# Patient Record
Sex: Male | Born: 1955 | Race: White | Hispanic: No | Marital: Married | Smoking: Former smoker
Health system: Southern US, Community
[De-identification: ages and names within clinical notes are randomized; demographics above are authoritative.]

## PROBLEM LIST (undated history)

## (undated) DIAGNOSIS — M545 Low back pain, unspecified: Secondary | ICD-10-CM

## (undated) DIAGNOSIS — Z860100 Personal history of colon polyps, unspecified: Secondary | ICD-10-CM

## (undated) DIAGNOSIS — M5030 Other cervical disc degeneration, unspecified cervical region: Secondary | ICD-10-CM

## (undated) DIAGNOSIS — G479 Sleep disorder, unspecified: Secondary | ICD-10-CM

## (undated) DIAGNOSIS — E785 Hyperlipidemia, unspecified: Secondary | ICD-10-CM

## (undated) DIAGNOSIS — L409 Psoriasis, unspecified: Secondary | ICD-10-CM

## (undated) DIAGNOSIS — K219 Gastro-esophageal reflux disease without esophagitis: Secondary | ICD-10-CM

## (undated) DIAGNOSIS — F419 Anxiety disorder, unspecified: Secondary | ICD-10-CM

## (undated) DIAGNOSIS — R51 Headache: Secondary | ICD-10-CM

## (undated) DIAGNOSIS — A4902 Methicillin resistant Staphylococcus aureus infection, unspecified site: Secondary | ICD-10-CM

## (undated) DIAGNOSIS — M7541 Impingement syndrome of right shoulder: Secondary | ICD-10-CM

## (undated) DIAGNOSIS — Z8249 Family history of ischemic heart disease and other diseases of the circulatory system: Secondary | ICD-10-CM

## (undated) DIAGNOSIS — I10 Essential (primary) hypertension: Secondary | ICD-10-CM

## (undated) DIAGNOSIS — K644 Residual hemorrhoidal skin tags: Secondary | ICD-10-CM

## (undated) DIAGNOSIS — F32A Depression, unspecified: Secondary | ICD-10-CM

## (undated) DIAGNOSIS — N200 Calculus of kidney: Secondary | ICD-10-CM

## (undated) DIAGNOSIS — G8929 Other chronic pain: Secondary | ICD-10-CM

## (undated) DIAGNOSIS — M1990 Unspecified osteoarthritis, unspecified site: Secondary | ICD-10-CM

## (undated) DIAGNOSIS — K2970 Gastritis, unspecified, without bleeding: Secondary | ICD-10-CM

## (undated) DIAGNOSIS — M7710 Lateral epicondylitis, unspecified elbow: Secondary | ICD-10-CM

## (undated) DIAGNOSIS — F329 Major depressive disorder, single episode, unspecified: Secondary | ICD-10-CM

## (undated) DIAGNOSIS — G5710 Meralgia paresthetica, unspecified lower limb: Secondary | ICD-10-CM

## (undated) DIAGNOSIS — R519 Headache, unspecified: Secondary | ICD-10-CM

## (undated) HISTORY — DX: Personal history of colon polyps: Z86.010

## (undated) HISTORY — DX: Lateral epicondylitis, unspecified elbow: M77.10

## (undated) HISTORY — PX: POLYPECTOMY: SHX149

## (undated) HISTORY — DX: Residual hemorrhoidal skin tags: K64.4

## (undated) HISTORY — DX: Major depressive disorder, single episode, unspecified: F32.9

## (undated) HISTORY — PX: COLONOSCOPY: SHX174

## (undated) HISTORY — DX: Other cervical disc degeneration, unspecified cervical region: M50.30

## (undated) HISTORY — DX: Sleep disorder, unspecified: G47.9

## (undated) HISTORY — DX: Other chronic pain: G89.29

## (undated) HISTORY — DX: Personal history of colon polyps, unspecified: Z86.0100

## (undated) HISTORY — DX: Unspecified osteoarthritis, unspecified site: M19.90

## (undated) HISTORY — DX: Methicillin resistant Staphylococcus aureus infection, unspecified site: A49.02

## (undated) HISTORY — DX: Meralgia paresthetica, unspecified lower limb: G57.10

## (undated) HISTORY — DX: Essential (primary) hypertension: I10

## (undated) HISTORY — DX: Anxiety disorder, unspecified: F41.9

## (undated) HISTORY — DX: Hyperlipidemia, unspecified: E78.5

## (undated) HISTORY — DX: Calculus of kidney: N20.0

## (undated) HISTORY — DX: Headache: R51

## (undated) HISTORY — DX: Gastro-esophageal reflux disease without esophagitis: K21.9

## (undated) HISTORY — DX: Impingement syndrome of right shoulder: M75.41

## (undated) HISTORY — DX: Gastritis, unspecified, without bleeding: K29.70

## (undated) HISTORY — DX: Family history of ischemic heart disease and other diseases of the circulatory system: Z82.49

## (undated) HISTORY — PX: FINGER SURGERY: SHX640

## (undated) HISTORY — DX: Low back pain, unspecified: M54.50

## (undated) HISTORY — PX: ROTATOR CUFF REPAIR: SHX139

## (undated) HISTORY — DX: Headache, unspecified: R51.9

## (undated) HISTORY — DX: Low back pain: M54.5

## (undated) HISTORY — DX: Depression, unspecified: F32.A

## (undated) HISTORY — DX: Psoriasis, unspecified: L40.9

---

## 1999-10-02 ENCOUNTER — Emergency Department (HOSPITAL_COMMUNITY): Admission: EM | Admit: 1999-10-02 | Discharge: 1999-10-02 | Payer: Self-pay | Admitting: Emergency Medicine

## 1999-10-21 ENCOUNTER — Emergency Department (HOSPITAL_COMMUNITY): Admission: EM | Admit: 1999-10-21 | Discharge: 1999-10-21 | Payer: Self-pay | Admitting: Emergency Medicine

## 2000-04-27 ENCOUNTER — Emergency Department (HOSPITAL_COMMUNITY): Admission: EM | Admit: 2000-04-27 | Discharge: 2000-04-28 | Payer: Self-pay | Admitting: Emergency Medicine

## 2000-04-28 ENCOUNTER — Encounter: Payer: Self-pay | Admitting: Emergency Medicine

## 2000-05-11 ENCOUNTER — Encounter: Admission: RE | Admit: 2000-05-11 | Discharge: 2000-05-11 | Payer: Self-pay | Admitting: Urology

## 2000-05-11 ENCOUNTER — Encounter: Payer: Self-pay | Admitting: Urology

## 2000-07-11 ENCOUNTER — Encounter: Payer: Self-pay | Admitting: Urology

## 2000-07-11 ENCOUNTER — Encounter: Admission: RE | Admit: 2000-07-11 | Discharge: 2000-07-11 | Payer: Self-pay | Admitting: Urology

## 2000-07-27 ENCOUNTER — Encounter: Payer: Self-pay | Admitting: Urology

## 2000-07-27 ENCOUNTER — Ambulatory Visit (HOSPITAL_BASED_OUTPATIENT_CLINIC_OR_DEPARTMENT_OTHER): Admission: RE | Admit: 2000-07-27 | Discharge: 2000-07-27 | Payer: Self-pay | Admitting: Urology

## 2000-08-11 ENCOUNTER — Encounter: Payer: Self-pay | Admitting: Urology

## 2000-08-11 ENCOUNTER — Encounter: Admission: RE | Admit: 2000-08-11 | Discharge: 2000-08-11 | Payer: Self-pay | Admitting: Urology

## 2000-09-09 ENCOUNTER — Encounter: Admission: RE | Admit: 2000-09-09 | Discharge: 2000-09-09 | Payer: Self-pay | Admitting: Urology

## 2000-09-09 ENCOUNTER — Encounter: Payer: Self-pay | Admitting: Urology

## 2000-09-12 ENCOUNTER — Encounter: Payer: Self-pay | Admitting: Emergency Medicine

## 2000-09-12 ENCOUNTER — Emergency Department (HOSPITAL_COMMUNITY): Admission: EM | Admit: 2000-09-12 | Discharge: 2000-09-12 | Payer: Self-pay | Admitting: Emergency Medicine

## 2000-09-13 ENCOUNTER — Encounter: Payer: Self-pay | Admitting: Urology

## 2000-09-13 ENCOUNTER — Ambulatory Visit (HOSPITAL_COMMUNITY): Admission: RE | Admit: 2000-09-13 | Discharge: 2000-09-13 | Payer: Self-pay | Admitting: Urology

## 2001-05-29 ENCOUNTER — Encounter: Payer: Self-pay | Admitting: Gastroenterology

## 2001-05-29 ENCOUNTER — Encounter: Admission: RE | Admit: 2001-05-29 | Discharge: 2001-05-29 | Payer: Self-pay | Admitting: Gastroenterology

## 2002-09-29 ENCOUNTER — Encounter: Admission: RE | Admit: 2002-09-29 | Discharge: 2002-09-29 | Payer: Self-pay | Admitting: Internal Medicine

## 2002-09-29 ENCOUNTER — Encounter: Payer: Self-pay | Admitting: Internal Medicine

## 2003-03-30 ENCOUNTER — Encounter: Payer: Self-pay | Admitting: Internal Medicine

## 2003-03-30 ENCOUNTER — Ambulatory Visit: Admission: RE | Admit: 2003-03-30 | Discharge: 2003-03-30 | Payer: Self-pay | Admitting: Internal Medicine

## 2004-02-24 ENCOUNTER — Encounter: Admission: RE | Admit: 2004-02-24 | Discharge: 2004-02-24 | Payer: Self-pay | Admitting: Internal Medicine

## 2004-12-02 ENCOUNTER — Ambulatory Visit: Payer: Self-pay | Admitting: Gastroenterology

## 2005-01-08 ENCOUNTER — Encounter: Admission: RE | Admit: 2005-01-08 | Discharge: 2005-01-08 | Payer: Self-pay | Admitting: Orthopedic Surgery

## 2005-05-31 ENCOUNTER — Ambulatory Visit: Payer: Self-pay | Admitting: Gastroenterology

## 2005-06-07 ENCOUNTER — Ambulatory Visit: Payer: Self-pay | Admitting: Gastroenterology

## 2005-06-15 ENCOUNTER — Ambulatory Visit: Payer: Self-pay | Admitting: Gastroenterology

## 2006-07-20 ENCOUNTER — Ambulatory Visit: Payer: Self-pay | Admitting: Internal Medicine

## 2006-07-29 ENCOUNTER — Ambulatory Visit: Payer: Self-pay | Admitting: Internal Medicine

## 2006-10-04 ENCOUNTER — Ambulatory Visit: Payer: Self-pay | Admitting: Internal Medicine

## 2007-05-30 ENCOUNTER — Ambulatory Visit: Payer: Self-pay | Admitting: Internal Medicine

## 2007-05-30 LAB — CONVERTED CEMR LAB
Albumin: 4.2 g/dL (ref 3.5–5.2)
Alkaline Phosphatase: 77 units/L (ref 39–117)
BUN: 15 mg/dL (ref 6–23)
Basophils Absolute: 0 10*3/uL (ref 0.0–0.1)
Cholesterol: 172 mg/dL (ref 0–200)
Creatinine, Ser: 1 mg/dL (ref 0.4–1.5)
GFR calc Af Amer: 103 mL/min
H Pylori IgG: NEGATIVE
HCT: 48.4 % (ref 39.0–52.0)
HCV Ab: NEGATIVE
HDL: 26.5 mg/dL — ABNORMAL LOW (ref >39.0)
Hemoglobin: 16.5 g/dL (ref 13.0–17.0)
Hep B S Ab: NEGATIVE
Hgb A1c MFr Bld: 5.5 % (ref 4.6–6.0)
LDL Cholesterol: 130 mg/dL — ABNORMAL HIGH (ref 0–99)
Lymphocytes Relative: 23.9 % (ref 12.0–46.0)
MCHC: 34.2 g/dL (ref 30.0–36.0)
MCV: 88.7 fL (ref 78.0–100.0)
Monocytes Absolute: 0.4 10*3/uL (ref 0.2–0.7)
Monocytes Relative: 7.4 % (ref 3.0–11.0)
Neutro Abs: 3.5 10*3/uL (ref 1.4–7.7)
Neutrophils Relative %: 67.4 % (ref 43.0–77.0)
Potassium: 4.6 meq/L (ref 3.5–5.1)
RDW: 12.4 % (ref 11.5–14.6)
Sed Rate: 6 mm/hr (ref 0–20)
Sodium: 142 meq/L (ref 135–145)
Total Bilirubin: 0.6 mg/dL (ref 0.3–1.2)
Total Protein: 7.3 g/dL (ref 6.0–8.3)

## 2007-08-15 ENCOUNTER — Ambulatory Visit: Payer: Self-pay | Admitting: Endocrinology

## 2007-10-03 ENCOUNTER — Telehealth: Payer: Self-pay | Admitting: Internal Medicine

## 2007-10-12 ENCOUNTER — Telehealth: Payer: Self-pay | Admitting: Internal Medicine

## 2007-11-21 ENCOUNTER — Ambulatory Visit: Payer: Self-pay | Admitting: Internal Medicine

## 2007-11-21 DIAGNOSIS — M1990 Unspecified osteoarthritis, unspecified site: Secondary | ICD-10-CM | POA: Insufficient documentation

## 2007-11-21 DIAGNOSIS — M5441 Lumbago with sciatica, right side: Secondary | ICD-10-CM

## 2007-11-21 DIAGNOSIS — Z860100 Personal history of colon polyps, unspecified: Secondary | ICD-10-CM

## 2007-11-21 DIAGNOSIS — G5710 Meralgia paresthetica, unspecified lower limb: Secondary | ICD-10-CM | POA: Insufficient documentation

## 2007-11-21 DIAGNOSIS — G8929 Other chronic pain: Secondary | ICD-10-CM | POA: Insufficient documentation

## 2007-11-21 HISTORY — DX: Personal history of colonic polyps: Z86.010

## 2007-11-21 HISTORY — DX: Unspecified osteoarthritis, unspecified site: M19.90

## 2007-11-21 HISTORY — DX: Personal history of colon polyps, unspecified: Z86.0100

## 2007-11-21 HISTORY — DX: Other chronic pain: G89.29

## 2007-12-07 DIAGNOSIS — A4902 Methicillin resistant Staphylococcus aureus infection, unspecified site: Secondary | ICD-10-CM | POA: Insufficient documentation

## 2007-12-07 HISTORY — DX: Methicillin resistant Staphylococcus aureus infection, unspecified site: A49.02

## 2007-12-21 ENCOUNTER — Encounter: Payer: Self-pay | Admitting: Internal Medicine

## 2007-12-27 ENCOUNTER — Ambulatory Visit: Payer: Self-pay | Admitting: Internal Medicine

## 2008-02-06 ENCOUNTER — Encounter (INDEPENDENT_AMBULATORY_CARE_PROVIDER_SITE_OTHER): Payer: Self-pay | Admitting: *Deleted

## 2008-05-10 ENCOUNTER — Encounter: Payer: Self-pay | Admitting: *Deleted

## 2008-05-10 DIAGNOSIS — K2990 Gastroduodenitis, unspecified, without bleeding: Secondary | ICD-10-CM

## 2008-05-10 DIAGNOSIS — K644 Residual hemorrhoidal skin tags: Secondary | ICD-10-CM | POA: Insufficient documentation

## 2008-05-10 DIAGNOSIS — E782 Mixed hyperlipidemia: Secondary | ICD-10-CM

## 2008-05-10 DIAGNOSIS — Z9189 Other specified personal risk factors, not elsewhere classified: Secondary | ICD-10-CM | POA: Insufficient documentation

## 2008-05-10 DIAGNOSIS — K219 Gastro-esophageal reflux disease without esophagitis: Secondary | ICD-10-CM

## 2008-05-10 DIAGNOSIS — M5030 Other cervical disc degeneration, unspecified cervical region: Secondary | ICD-10-CM | POA: Insufficient documentation

## 2008-05-10 DIAGNOSIS — Z87442 Personal history of urinary calculi: Secondary | ICD-10-CM | POA: Insufficient documentation

## 2008-05-10 DIAGNOSIS — F341 Dysthymic disorder: Secondary | ICD-10-CM

## 2008-05-10 DIAGNOSIS — M7710 Lateral epicondylitis, unspecified elbow: Secondary | ICD-10-CM

## 2008-05-10 DIAGNOSIS — M7580 Other shoulder lesions, unspecified shoulder: Secondary | ICD-10-CM

## 2008-05-10 DIAGNOSIS — M25819 Other specified joint disorders, unspecified shoulder: Secondary | ICD-10-CM

## 2008-05-10 DIAGNOSIS — K2970 Gastritis, unspecified, without bleeding: Secondary | ICD-10-CM | POA: Insufficient documentation

## 2008-05-10 DIAGNOSIS — I10 Essential (primary) hypertension: Secondary | ICD-10-CM | POA: Insufficient documentation

## 2008-05-10 HISTORY — DX: Other cervical disc degeneration, unspecified cervical region: M50.30

## 2008-05-10 HISTORY — DX: Essential (primary) hypertension: I10

## 2008-05-10 HISTORY — DX: Gastritis, unspecified, without bleeding: K29.70

## 2008-05-10 HISTORY — DX: Lateral epicondylitis, unspecified elbow: M77.10

## 2008-05-10 HISTORY — DX: Dysthymic disorder: F34.1

## 2008-05-10 HISTORY — DX: Other specified personal risk factors, not elsewhere classified: Z91.89

## 2008-05-10 HISTORY — DX: Residual hemorrhoidal skin tags: K64.4

## 2008-05-10 HISTORY — DX: Mixed hyperlipidemia: E78.2

## 2008-05-10 HISTORY — DX: Other specified joint disorders, unspecified shoulder: M25.819

## 2008-05-10 HISTORY — DX: Gastro-esophageal reflux disease without esophagitis: K21.9

## 2008-05-10 HISTORY — DX: Personal history of urinary calculi: Z87.442

## 2008-06-13 ENCOUNTER — Ambulatory Visit: Payer: Self-pay | Admitting: Internal Medicine

## 2008-06-13 DIAGNOSIS — L0390 Cellulitis, unspecified: Secondary | ICD-10-CM

## 2008-06-13 DIAGNOSIS — L0291 Cutaneous abscess, unspecified: Secondary | ICD-10-CM

## 2008-06-13 DIAGNOSIS — B957 Other staphylococcus as the cause of diseases classified elsewhere: Secondary | ICD-10-CM | POA: Insufficient documentation

## 2008-06-13 DIAGNOSIS — N63 Unspecified lump in unspecified breast: Secondary | ICD-10-CM | POA: Insufficient documentation

## 2008-06-13 HISTORY — DX: Unspecified lump in unspecified breast: N63.0

## 2008-06-13 HISTORY — DX: Cutaneous abscess, unspecified: L02.91

## 2008-06-13 HISTORY — DX: Other staphylococcus as the cause of diseases classified elsewhere: B95.7

## 2008-06-13 LAB — CONVERTED CEMR LAB
BUN: 12 mg/dL (ref 6–23)
Basophils Absolute: 0 10*3/uL (ref 0.0–0.1)
Basophils Relative: 0.5 % (ref 0.0–1.0)
CO2: 30 meq/L (ref 19–32)
Calcium: 9.6 mg/dL (ref 8.4–10.5)
Chloride: 105 meq/L (ref 96–112)
Creatinine, Ser: 0.9 mg/dL (ref 0.4–1.5)
Eosinophils Absolute: 0.1 10*3/uL (ref 0.0–0.7)
Eosinophils Relative: 1.3 % (ref 0.0–5.0)
GFR calc Af Amer: 115 mL/min
GFR calc non Af Amer: 95 mL/min
Glucose, Bld: 91 mg/dL (ref 70–99)
HCT: 46 % (ref 39.0–52.0)
Hemoglobin: 15.9 g/dL (ref 13.0–17.0)
Lymphocytes Relative: 24.7 % (ref 12.0–46.0)
MCHC: 34.5 g/dL (ref 30.0–36.0)
MCV: 90 fL (ref 78.0–100.0)
Monocytes Absolute: 0.7 10*3/uL (ref 0.1–1.0)
Monocytes Relative: 9.1 % (ref 3.0–12.0)
Neutro Abs: 4.6 10*3/uL (ref 1.4–7.7)
Neutrophils Relative %: 64.4 % (ref 43.0–77.0)
Platelets: 276 10*3/uL (ref 150–400)
Potassium: 4.7 meq/L (ref 3.5–5.1)
RBC: 5.11 M/uL (ref 4.22–5.81)
RDW: 12.7 % (ref 11.5–14.6)
Sodium: 141 meq/L (ref 135–145)
WBC: 7.2 10*3/uL (ref 4.5–10.5)

## 2008-06-28 ENCOUNTER — Encounter: Admission: RE | Admit: 2008-06-28 | Discharge: 2008-06-28 | Payer: Self-pay | Admitting: Internal Medicine

## 2009-04-14 ENCOUNTER — Ambulatory Visit: Payer: Self-pay | Admitting: Internal Medicine

## 2009-04-14 DIAGNOSIS — R21 Rash and other nonspecific skin eruption: Secondary | ICD-10-CM

## 2009-04-14 DIAGNOSIS — M129 Arthropathy, unspecified: Secondary | ICD-10-CM

## 2009-04-14 HISTORY — DX: Arthropathy, unspecified: M12.9

## 2009-04-14 HISTORY — DX: Rash and other nonspecific skin eruption: R21

## 2009-04-14 LAB — CONVERTED CEMR LAB: Vit D, 25-Hydroxy: 39 ng/mL (ref 30–89)

## 2009-04-17 LAB — CONVERTED CEMR LAB
BUN: 20 mg/dL (ref 6–23)
Basophils Relative: 0.5 % (ref 0.0–3.0)
CO2: 30 meq/L (ref 19–32)
Calcium: 9.6 mg/dL (ref 8.4–10.5)
Creatinine, Ser: 0.8 mg/dL (ref 0.4–1.5)
Eosinophils Relative: 0.9 % (ref 0.0–5.0)
Glucose, Bld: 80 mg/dL (ref 70–99)
HCT: 45.6 % (ref 39.0–52.0)
Ketones, ur: NEGATIVE mg/dL
Leukocytes, UA: NEGATIVE
Lymphs Abs: 1.9 10*3/uL (ref 0.7–4.0)
MCV: 89.5 fL (ref 78.0–100.0)
Monocytes Absolute: 0.4 10*3/uL (ref 0.1–1.0)
Monocytes Relative: 5.7 % (ref 3.0–12.0)
Neutrophils Relative %: 66.1 % (ref 43.0–77.0)
RBC: 5.09 M/uL (ref 4.22–5.81)
Rhuematoid fact SerPl-aCnc: <20 intl units/mL (ref 0.0–20.0)
Sed Rate: 8 mm/hr (ref 0–22)
Sodium: 143 meq/L (ref 135–145)
Specific Gravity, Urine: >=1.03 (ref 1.000–1.030)
TSH: 3 microintl units/mL (ref 0.35–5.50)
Uric Acid, Serum: 6.2 mg/dL (ref 4.0–7.8)
Urobilinogen, UA: 0.2 (ref 0.0–1.0)
WBC: 7.2 10*3/uL (ref 4.5–10.5)
pH: 6 (ref 5.0–8.0)

## 2009-06-23 ENCOUNTER — Telehealth: Payer: Self-pay | Admitting: Internal Medicine

## 2009-09-11 ENCOUNTER — Telehealth: Payer: Self-pay | Admitting: Internal Medicine

## 2009-09-12 ENCOUNTER — Ambulatory Visit: Payer: Self-pay | Admitting: Internal Medicine

## 2009-09-12 ENCOUNTER — Ambulatory Visit: Payer: Self-pay | Admitting: Physician Assistant

## 2009-09-12 DIAGNOSIS — M25569 Pain in unspecified knee: Secondary | ICD-10-CM | POA: Insufficient documentation

## 2009-09-12 HISTORY — DX: Pain in unspecified knee: M25.569

## 2009-09-25 ENCOUNTER — Telehealth: Payer: Self-pay | Admitting: Internal Medicine

## 2009-10-03 ENCOUNTER — Telehealth: Payer: Self-pay | Admitting: Internal Medicine

## 2010-01-25 IMAGING — CR DG KNEE 1-2V*L*
2 series · 2 of 2 positions shown · non-contrast
Comparison: None

CLINICAL DATA: Left knee pain.

LEFT KNEE - 1-2 VIEW

[view not recorded (1 of 2)]
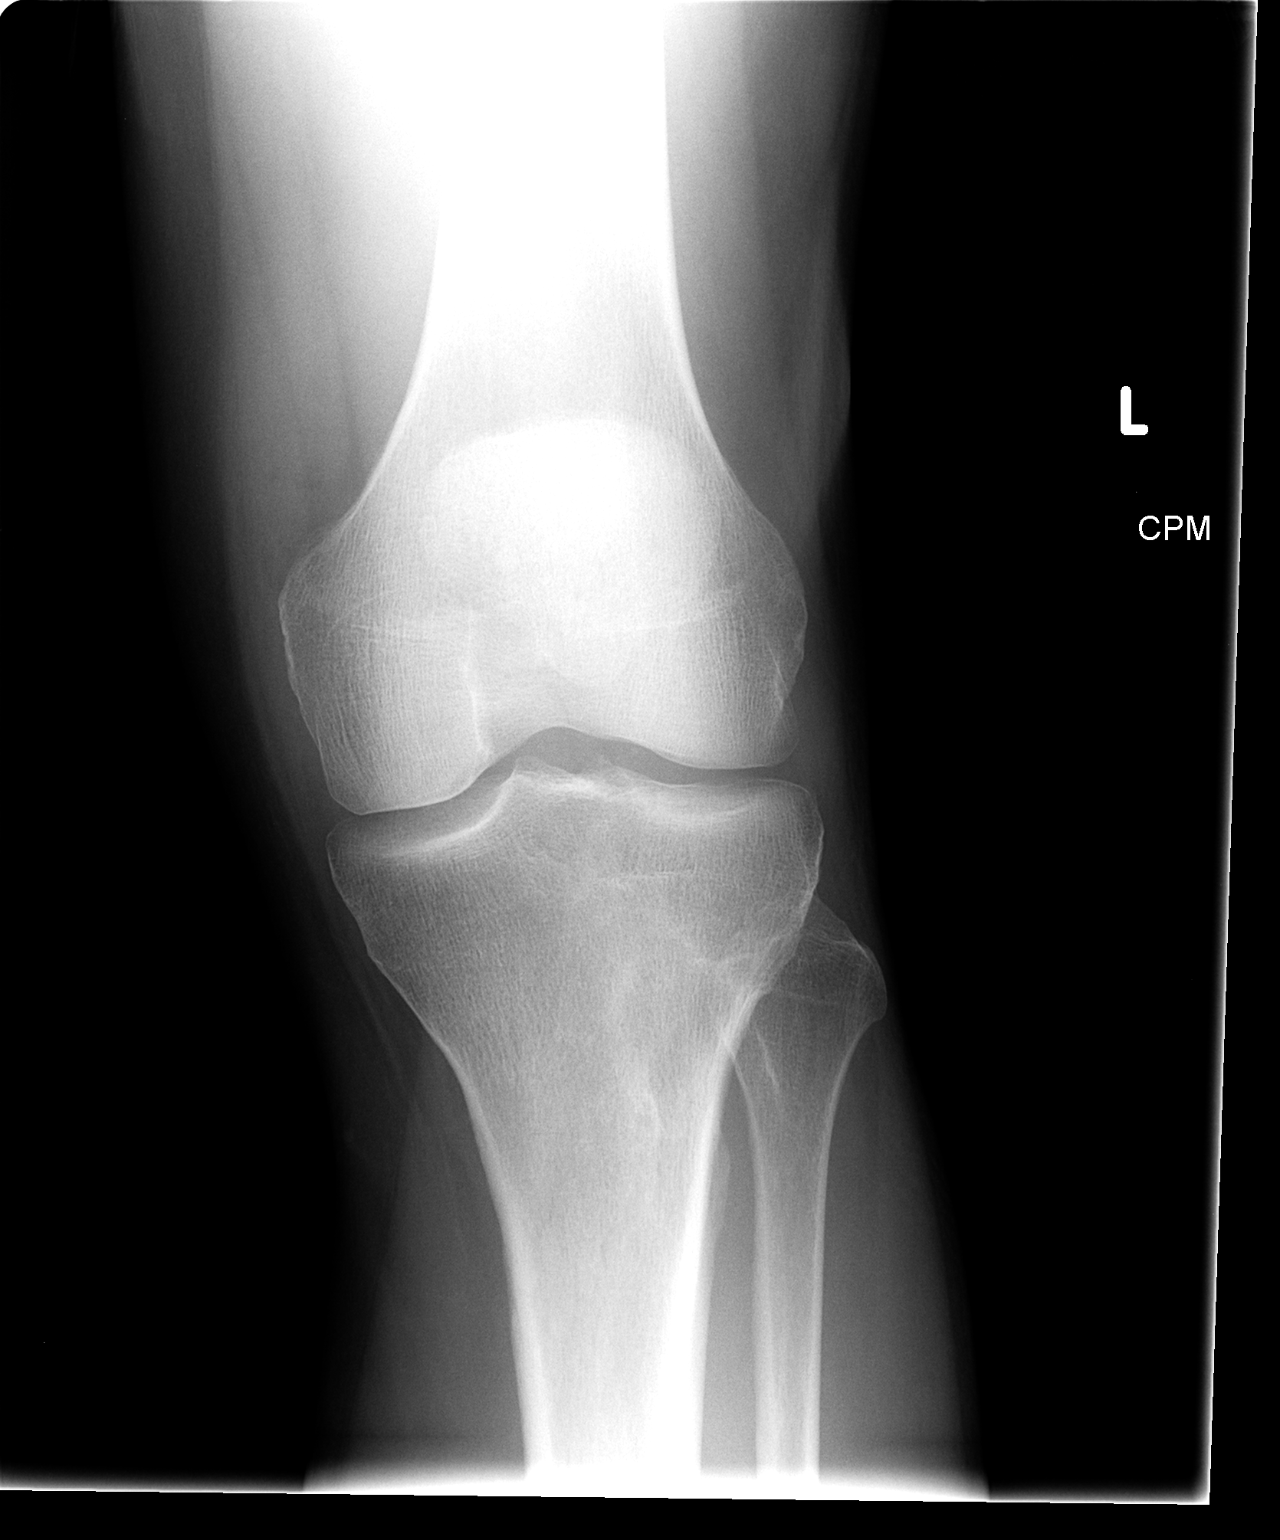

[view not recorded (2 of 2)]
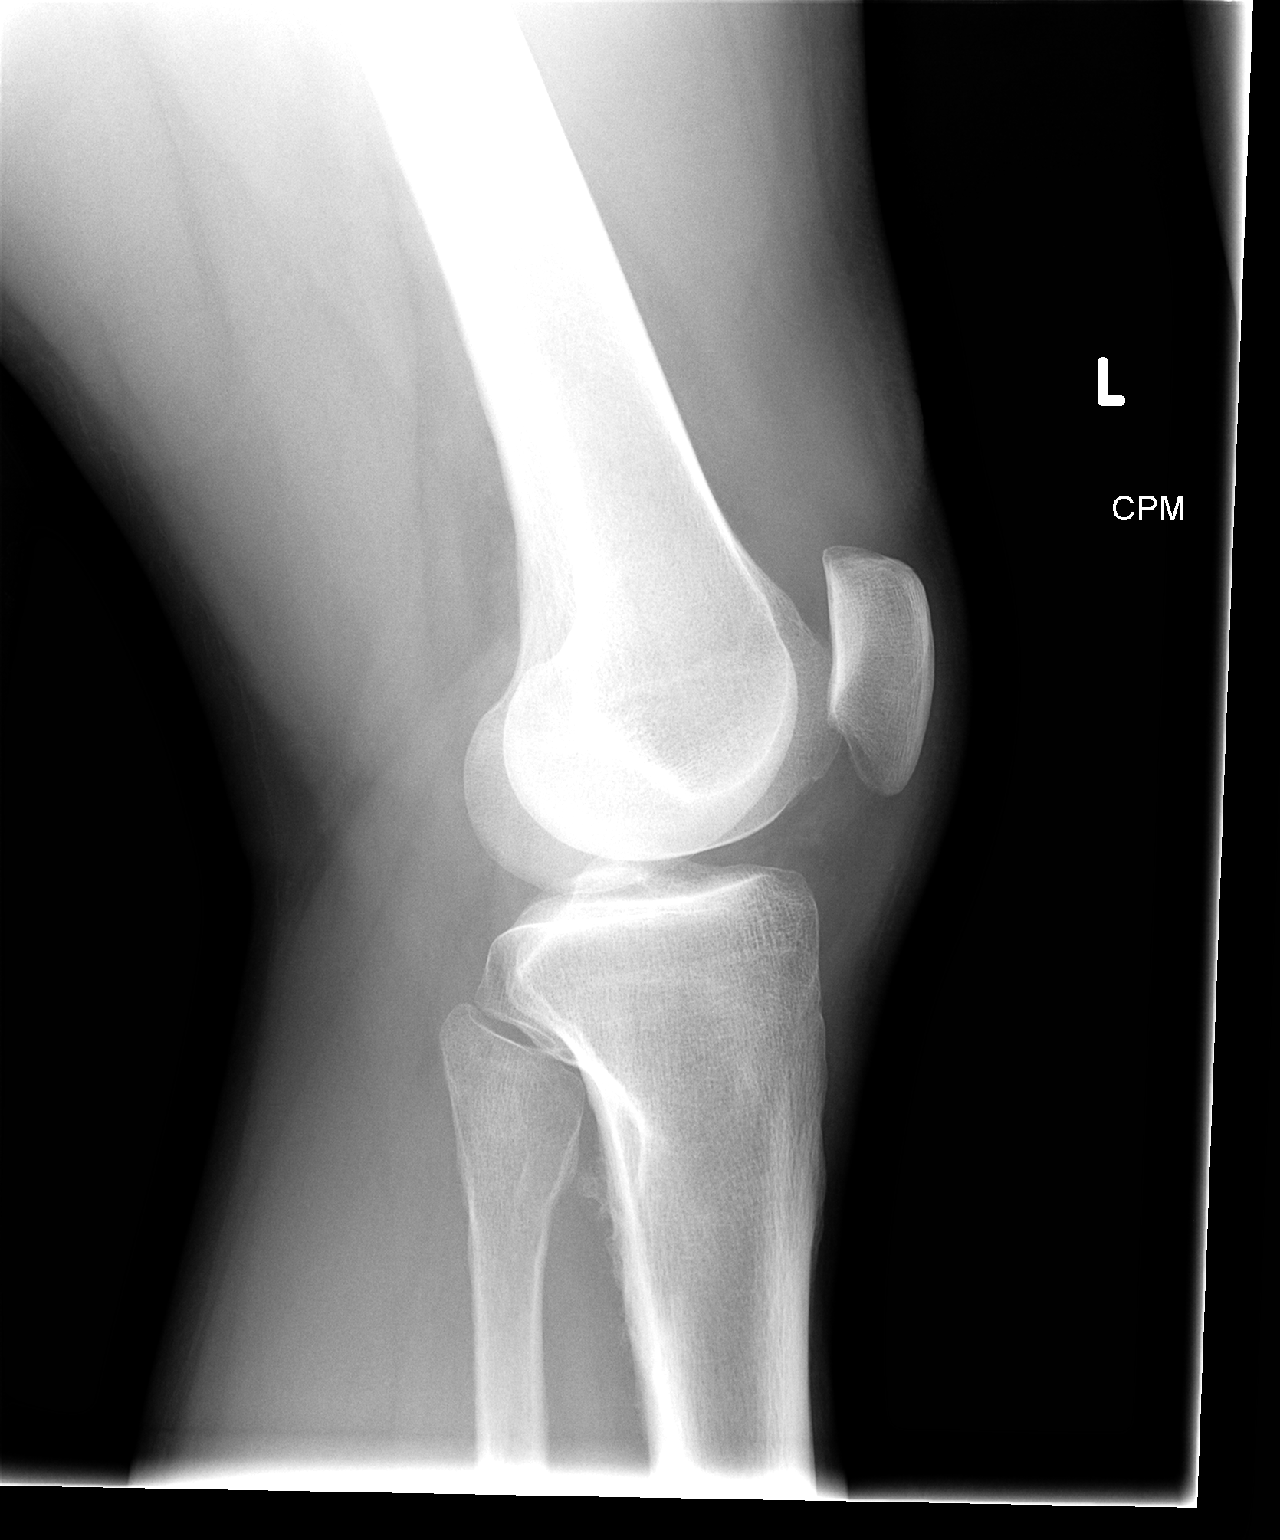

[2 of 2 positions shown; findings below may reference images not displayed]

FINDINGS: The joint spaces are maintained.  No fractures are seen.
There is a suprapatellar knee joint effusion.
IMPRESSION: 1.  No acute bony findings or significant degenerative changes.
2.  Knee joint effusion.

## 2010-02-16 ENCOUNTER — Telehealth: Payer: Self-pay | Admitting: Internal Medicine

## 2010-09-18 ENCOUNTER — Ambulatory Visit: Payer: Self-pay | Admitting: Cardiovascular Disease

## 2010-09-28 ENCOUNTER — Encounter: Payer: Self-pay | Admitting: Internal Medicine

## 2010-10-07 ENCOUNTER — Encounter: Payer: Self-pay | Admitting: Internal Medicine

## 2010-10-15 ENCOUNTER — Telehealth: Payer: Self-pay | Admitting: Internal Medicine

## 2010-11-05 ENCOUNTER — Telehealth (INDEPENDENT_AMBULATORY_CARE_PROVIDER_SITE_OTHER): Payer: Self-pay | Admitting: *Deleted

## 2010-11-06 ENCOUNTER — Ambulatory Visit (HOSPITAL_COMMUNITY)
Admission: RE | Admit: 2010-11-06 | Discharge: 2010-11-06 | Payer: Self-pay | Source: Home / Self Care | Admitting: Cardiovascular Disease

## 2010-11-06 ENCOUNTER — Encounter: Payer: Self-pay | Admitting: Internal Medicine

## 2010-11-11 ENCOUNTER — Telehealth (INDEPENDENT_AMBULATORY_CARE_PROVIDER_SITE_OTHER): Payer: Self-pay | Admitting: *Deleted

## 2010-11-12 ENCOUNTER — Ambulatory Visit (HOSPITAL_COMMUNITY)
Admission: RE | Admit: 2010-11-12 | Discharge: 2010-11-12 | Payer: Self-pay | Source: Home / Self Care | Attending: Cardiovascular Disease | Admitting: Cardiovascular Disease

## 2010-11-12 ENCOUNTER — Ambulatory Visit: Payer: Self-pay

## 2010-11-17 ENCOUNTER — Telehealth: Payer: Self-pay | Admitting: Cardiovascular Disease

## 2011-01-05 NOTE — Miscellaneous (Signed)
Summary: Appointment Canceled  Appointment status changed to canceled by LinkLog[employer] on 10/07/2010 1:40 PM.  Cancellation Comments --------------------- c60/ stress echo/ dx: chestpain/ gd  Appointment Information ----------------------- Appt Type:  EMR NO DOCUMENT      Date:  Monday, October 12, 2010      Time:  8:30 AM for 30 min   Urgency:  Routine   Made By:  Pearson Grippe  To Visit:  [id number]    Reason:  c60/ stress echo/ dx: chestpain/ gd  Appt Comments ------------- -- 10/07/10 13:40: (CEMR) CANCELED -- c60/ stress echo/ dx: chestpain/ gd -- 09/28/10 11:22: (CEMR) BOOKED -- Routine EMR NO DOCUMENT at 10/12/2010 8:30 AM for 30 min c60/ stress echo/ dx: chestpain/ gd -- 09/28/10 11:21: (CEMR) BOOKED -- Routine EMR

## 2011-01-05 NOTE — Miscellaneous (Signed)
Summary: Appointment Canceled  Appointment status changed to canceled by [employer]Log[employer] on 09/28/2010 11:21 AM.  Cancellation Comments --------------------- ecs/ dx: chestpain.  pt has medicaid. gd  Appointment Information ----------------------- Appt Type:  CARDIOLOGY ANCILLARY VISIT      Date:  Thursday, October 01, 2010      Time:  11:30 AM for 60 min   Urgency:  Routine   Made By:  Pearson Grippe  To Visit:  [id number]    Reason:  ecs/ dx: chestpain.  pt has medicaid. gd  Appt Comments ------------- -- 09/28/10 11:21: (CEMR) CANCELED -- ecs/ dx: chestpain.  pt has medicaid. gd -- 09/18/10 11:12: (CEMR) BOOKED -- Routine CARDIOLOGY ANCILLARY VISIT at 10/01/2010 11:30 AM for 60 min ecs/ dx: chestpain.  pt has medicaid. gd

## 2011-01-05 NOTE — Progress Notes (Signed)
Summary: Stress Echo Pre-Procedure  Phone Note Outgoing Call   Call placed by: Antionette Char RN,  November 05, 2010 11:31 AM Call placed to: Patient Reason for Call: Confirm/change Appt Summary of Call: Patient given instructions for stress echo. Annice Pih SmithRN

## 2011-01-05 NOTE — Miscellaneous (Signed)
Summary: Appointment Canceled  Appointment status changed to canceled by [employer] on 09/28/2010 11:12 AM.  Cancellation Comments --------------------- ec6 / stress echo/ gd  Appointment Information ----------------------- Appt Type:  CARDIOLOGY ANCILLARY VISIT      Date:  Thursday, October 01, 2010      Time:  11:30 AM for 60 min   Urgency:  Routine   Made By:  Pearson Grippe  To Visit:  [id number]    Reason:  ec6 / stress echo/ gd  Appt Comments ------------- -- 09/28/10 11:12: (CEMR) CANCELED -- ec6 / stress echo/ gd -- 09/18/10 11:12: (CEMR) BOOKED -- Routine CARDIOLOGY ANCILLARY VISIT at 10/01/2010 11:30 AM for 60 min ec6 / stress echo/ gd

## 2011-01-05 NOTE — Miscellaneous (Signed)
Summary: Appointment Canceled  Appointment status changed to canceled by [employer] on 10/07/2010 1:40 PM.  Cancellation Comments --------------------- ecs/ dx: chestpain.  pt has medicaid. gd  Appointment Information ----------------------- Appt Type:  CARDIOLOGY ANCILLARY VISIT      Date:  Monday, October 12, 2010      Time:  8:30 AM for 60 min   Urgency:  Routine   Made By:  Pearson Grippe  To Visit:  [id number]    Reason:  ecs/ dx: chestpain.  pt has medicaid. gd  Appt Comments ------------- -- 10/07/10 13:40: (CEMR) CANCELED -- ecs/ dx: chestpain.  pt has medicaid. gd -- 09/28/10 11:21: (CEMR) BOOKED -- Routine CARDIOLOGY ANCILLARY VISIT at 10/12/2010 8:30 AM for 60 min ecs/ dx: chestpain.  pt has medicaid. gd

## 2011-01-05 NOTE — Progress Notes (Signed)
Summary: Stress Echo Pre-Procedure  Phone Note Outgoing Call   Call placed by: Milana Na, EMT-P,  November 11, 2010 3:49 PM Summary of Call: Reviewed information on [employer] Information Sheet (see scanned document for further details).  Spoke with patient's wife for Stress Echo.

## 2011-01-05 NOTE — Progress Notes (Signed)
Summary: PSA?  Phone Note Call from Patient Call back at Cell [phone]   Summary of Call: Pt's wife called. She wanted to know when last PSA was and if she was due for check. Pt is due to cpx & labs b/c it has been > 1 year since last office visit. I could not give any info to pt's wife due to pt not having HIPPA form on file. I left vm for pt to call office back.  Initial call taken by: Lamar Sprinkles, CMA,  October 15, 2010 11:14 AM  Follow-up for Phone Call        Pt informed  Follow-up by: Lamar Sprinkles, CMA,  October 16, 2010 10:33 AM

## 2011-01-05 NOTE — Progress Notes (Signed)
Summary: REQ FOR ABX  Phone Note Call from Patient Call back at Home Phone [phone]   Summary of Call: Pt was given rx of doxycycline last july for boil on his leg. He was told if another one developed to call office and he would prescribe another round of antibiotics. Pt c/o red, hot, swollen, "ingrown hair" on inner thigh that started yesterday and he would like rx.  Initial call taken by: Lamar Sprinkles, CMA,  February 16, 2010 8:40 AM  Follow-up for Phone Call        rx is fine but please inform him that it is my recommendation that he come in for an I and D Follow-up by: Etta Grandchild MD,  February 16, 2010 8:57 AM  Additional Follow-up for Phone Call Additional follow up Details #1::        left mess to call office back.....................Marland KitchenLamar Sprinkles, CMA  February 16, 2010 5:50 PM   left mess to call office back...................Marland KitchenLamar Sprinkles, CMA  February 17, 2010 6:24 PM   left mess to call office back........................Marland KitchenLamar Sprinkles, CMA  February 19, 2010 9:35 AM     Additional Follow-up for Phone Call Additional follow up Details #2::    left mess to call office back......................closing phone note. Follow-up by: Lamar Sprinkles, CMA,  February 20, 2010 4:09 PM

## 2011-01-07 NOTE — Miscellaneous (Signed)
Summary: Appointment Canceled  Appointment status changed to canceled by [employer] on 11/06/2010 3:35 PM.  Cancellation Comments --------------------- ECS/ STRESS ECHO/ DX: CHESTPAIN/ PT HAS MEDICADE. GD  Appointment Information ----------------------- Appt Type:  CARDIOLOGY ANCILLARY VISIT      Date:  Friday, November 06, 2010      Time:  2:00 PM for 60 min   Urgency:  Routine   Made By:  Hoy Finlay Scheduler  To Visit:  [id number]    Reason:  ECS/ STRESS ECHO/ DX: CHESTPAIN/ PT HAS MEDICADE. GD  Appt Comments ------------- -- 11/06/10 15:35: (CEMR) CANCELED -- ECS/ STRESS ECHO/ DX: CHESTPAIN/ PT HAS MEDICADE. GD -- 11/06/10 13:54: (CEMR) ARRIVED -- ECS/ STRESS ECHO/ DX: CHESTPAIN/ PT HAS MEDICADE. GD -- 11/05/10 16:32: (CEMR) BOOKED -- Routine CARDIOLOGY ANCILLARY VISIT a

## 2011-01-07 NOTE — Progress Notes (Signed)
Summary: stress test results  Phone Note Outgoing Call   Call placed by: Dessie Coma  LPN,  November 17, 2010 9:36 AM Call placed to: Patient Summary of Call: Patient notified per Dr. Kirke Corin, stress test was normal.

## 2011-01-07 NOTE — Miscellaneous (Signed)
Summary: Appointment Canceled  Appointment status changed to canceled by [employer]Log[employer] on 11/06/2010 3:37 PM.  Cancellation Comments --------------------- C60- STRESS ECHO/ DX: CHESTPAIN/ PT HAS MEDICAID. GD  Appointment Information ----------------------- Appt Type:  EMR NO DOCUMENT      Date:  Friday, November 06, 2010      Time:  2:00 PM for 30 min   Urgency:  Routine   Made By:  Hoy Finlay Scheduler  To Visit:  [id number]    Reason:  C60- STRESS ECHO/ DX: CHESTPAIN/ PT HAS MEDICAID. GD  Appt Comments ------------- -- 11/06/10 15:37: (CEMR) CANCELED -- C60- STRESS ECHO/ DX: CHESTPAIN/ PT HAS MEDICAID. GD -- 11/06/10 14:22: (CEMR) ARRIVED -- C60- STRESS ECHO/ DX: CHESTPAIN/ PT HAS MEDICAID. GD -- 10/20/10 10:55: (CEMR) BOOKED -- Routine EMR NO DOCUMENT at 11/07/19

## 2011-04-20 NOTE — Letter (Signed)
September 18, 2010    Gus Height, PA  [facility]  [address]  [place], [place] [address]   RE:  OLUWATOMISIN, DEMAN  MRN:  [id number]  /  DOB:  [birth year]   Dear Sue Lush:   Thank you for referring Mr. Kessinger for further cardiac evaluation.  As  you are aware, this is a pleasant 55 year old gentleman with the  following problem list:  1. Tobacco use.  2. Family history of premature coronary artery disease.   Mr. Catherman is here today for evaluation of recent chest pain.  This  started about 2 weeks ago.  He describes substernal chest discomfort  described as a sharp pain that happens at rest.  It does not radiate to  his arm or neck.  Usually it happens at rest and lasts for a few  minutes.  It resolves continuously.  Overall it is mild in severity.  He  exercises by walking and usually does not get exertional chest tightness  or heaviness.  There has been no dizziness, syncope or presyncope.  He  is currently trying to quit smoking.   PAST MEDICAL HISTORY:  Is as outlined above.   MEDICATIONS:  Include vitamin B12 once daily.   ALLERGIES:  No known drug allergies.   SOCIAL HISTORY:  He currently smokes three to four cigarettes a day and  trying to quit.  He used to smoke half a pack per day for 30 years.  He  denies any alcohol or recreational drug use.  The patient was recently  laid off from his work; he used to work as a Careers adviser.   PAST SURGICAL HISTORY:  Included right rotator cuff surgery.   REVIEW OF SYSTEMS:  Is remarkable for chest pain and mild dyspnea.  A  full review of system was performed and is otherwise negative.   FAMILY HISTORY:  Is remarkable for premature coronary artery disease.  His mother died at the age of 80 from myocardial infarction.  His sister  also died at the age of 104 from myocardial infarction.   PHYSICAL EXAMINATION:  GENERAL:  The patient is pleasant and appears to  be in no acute distress.  VITAL SIGNS:  Weight  is 178.2 pounds, blood pressure is 134/89, pulse is  74, oxygen saturation is 96% on room air.  HEENT:  Normocephalic, atraumatic.  NECK:  No JVD or carotid bruits.  RESPIRATORY:  Normal respiratory effort with no use of accessory  muscles.  Auscultation reveals normal breath sounds.  CARDIOVASCULAR:  Normal PMI.  Normal S1-S2 with no gallops or murmurs.  ABDOMEN:  Benign, nontender, nondistended.  EXTREMITIES:  With no clubbing, cyanosis or edema.  SKIN:  Warm and dry with no rash.  PSYCHIATRIC:  He is alert, oriented x3 with normal mood and affect.  MUSCULOSKELETAL:  There is normal muscle strength in the upper and lower  extremities.   An electrocardiogram was performed which showed normal sinus rhythm with  nonspecific ST-T wave changes.   IMPRESSION:  Atypical chest pain:  His current chest pain is really  atypical for angina.  It is sharp and it happens at rest.  It does not  seem to be pleuritic either.  His risk factors include tobacco use as  well as strong family history of premature coronary artery disease.  Thus, I think this warrants further evaluation.  I recommend proceeding  with a stress echocardiogram for further evaluation.  I instructed him  to take aspirin 81 mg daily  indefinitely due to his family history.  Also lifestyle changes with diet and exercise are recommended.  He will  follow up after his stress test.   Thank you for allowing me to participate in the care of your patient.    Sincerely,      Lorine Bears, MD  Electronically Signed    MA/MedQ  DD: 09/18/2010  DT: 09/18/2010  Job #: [id number]

## 2011-04-23 NOTE — Op Note (Signed)
. [facility]  Patient:    Brandon Wong, Brandon Wong                    MRN: [id number] Proc. Date: 07/27/00 Adm. Date:  [id number] Disc. Date: [id number] Attending:  Thermon Leyland                           Operative Report  PREOPERATIVE DIAGNOSIS: 1. Gross hematuria. 2. Left flank pain. 3. History of left ureteral calculus.  POSTOPERATIVE DIAGNOSIS: 1. Gross hematuria. 2. Left flank pain. 3. No ureteral stone seen.  OPERATION PERFORMED:  Cystoscopy, bilateral retrograde pyelography, a left-sided ureteroscopy, left double-J stent placement.  SURGEON:  Barron Alvine, M.D.  ANESTHESIA:  General.  INDICATIONS FOR PROCEDURE:  [name] is a 55 year old male.  I saw him in May at which time he had some classic left renal colic.  A CT scan revealed a small 2 mm stone in the distal left ureter.  He had microscopic hematuria.  He passed a small stone and was feeling better.  We felt that he possibly had some ongoing irritation.  He subsequently had more recurrent left-sided back discomfort.  We brought him in, he was continuing to have persistent microscopic hematuria.  For that reason, we felt that he probably still had a distal ureteral stone and had tentatively put him on the operative schedule. I went ahead and got intravenous pyelogram which showed no evidence of obstruction and no evidence of any obvious ureteral stone.  The feeling was that the intravenous pyelogram was normal.  For that reason, we decided to cancel surgery and elected to observe him.  He continued to have some twinges of left-sided back discomfort but also has had recurrent episodes of gross hematuria.  I saw him back in the office and he had too numerous to count red blood cells per high powered field.  There was no evidence of pyuria and he had no other significant voiding symptoms.  We felt that given the ongoing hematuria and the fact that he was having some left-sided  low back discomfort that retrograde pyelograms and possible ureteroscopy was indicated.  DESCRIPTION OF PROCEDURE:  The patient was brought to the operating room where he had successful induction of general anesthesia.  He was placed in lithotomy position and prepped and draped in the usual manner.  His anterior urethra was unremarkable on cystoscopy.  His prostate was relatively short, measuring 2.5 to 3 cm.  There was no significant visual obstruction.  There was no active bleeding or obvious abnormality within the prostatic mucosa.  The bladder mucosa itself showed some slight erythema but no evidence of any tumor or other significant pathology.  Both ureteral orifices were unremarkable.  A left retrograde pyelogram showed a fairly normal caliber ureter although there was a somewhat abrupt change in the caliber of ureter just 2 to 3 cm above the ureterovesical junction.  I saw no other filling defects or hydronephrosis.  A guide wire was placed and we performed rigid ureteroscopy without the need for ureteral dilation.  I was able to inspect the lower half of the ureter.  There was a small area of ureteral irritation distally where a stone may have been but I saw no stone or other pathology.  A right-sided retrograde pyelogram showed no evidence of obvious filling defects.  There was no evidence of obstruction and I saw no other abnormalities.  At the completion  of the left ureteroscopy, we went ahead and placed a 6 French 24 cm double-J stent.  We felt that we would leave this indwelling for 24 to 48 hours.  A dangle string was left which was secured to the patients penis.  He was brought to the recovery room in stable condition. DD:  07/27/00 TD:  07/28/00 Job: [id number]

## 2011-04-23 NOTE — Assessment & Plan Note (Signed)
[facility]                             PRIMARY CARE OFFICE NOTE   NAME:Brandon Wong, Brandon Wong                    MRN:          [id number]  DATE:07/29/2006                            DOB:          1956-02-05    HISTORY OF PRESENT ILLNESS:  The patient is a 55 year old male who presents  for wellness examination.   PAST MEDICAL HISTORY/FAMILY HISTORY/SOCIAL HISTORY:  As per September 24, 2002, notes.  He remains single, lives with girlfriend.   MEDICATIONS:  Current medicines reviewed.   ALLERGIES:  None.   REVIEW OF SYSTEMS:  He continues to smoke half pack a day.  No chest pain or  shortness of breath.  No syncope.  The rest is negative.   PHYSICAL EXAMINATION:  VITAL SIGNS:  Blood pressure 123/87, pulse 78,  temperature 98.3, weight 180 pounds.  GENERAL APPEARANCE:  Looks well.  HEENT:  Moist mucosa.  NECK:  Supple.  No thyromegaly or bruits.  LUNGS:  Clear to auscultation and percussion.  No wheeze or rales.  HEART:  S1, S2, no murmurs, rubs or gallops.  ABDOMEN:  Soft, nontender, no organomegaly or masses felt.  EXTREMITIES:  Lower extremities without edema.  NEUROLOGICAL:  He is alert, oriented and cooperative.  Denies being  depressed.  RECTAL:  Normal prostate and rectal stool guaiac negative.  Testicular exam  is normal and clear.   LABORATORY DATA:  On July 20, 2006, CBG normal, cholesterol 210, HDL 26,  LDL 164, TSH normal, Urinalysis normal.   ASSESSMENT/PLAN:  1. Normal wellness examination.  Age/health related issues discussed.      Healthy lifestyle discussed.  Needs to stop smoking.  Obtain chest x-      ray.  Repeat exam in 12 months.  2. Tobacco smoking.  Discussed in detail.  Would like to try Chantix,      prescribed starter pack and maintenance pack.  3. Dyslipidemia.  Advised baby aspirin daily.  He will try Niacin and fish      oil.  4. Mild erythematous function.  We will use Viagra 60/100 mg p.r.n.                      Sonda Primes, MD   AP/MedQ  DD:  08/02/2006  DT:  08/03/2006  Job #:  [id number]

## 2011-04-23 NOTE — Op Note (Signed)
[facility]  Patient:    Brandon Wong, Brandon Wong                    MRN: [id number] Proc. Date: 09/13/00 Adm. Date:  [id number] Attending:  Thermon Leyland                           Operative Report  PREOPERATIVE DIAGNOSIS:  Right mid ureteral calculus.  POSTOPERATIVE DIAGNOSIS:  Right mid ureteral calculus.  PROCEDURE PERFORMED:  Cystoscopy, retrograde pyelography, rigid and flexible ureteroscopy, holmium laser lithotripsy, and double-J stent placement.  SURGEON:  Barron Alvine, M.D.  ANESTHESIA:  General.  INDICATIONS:  The patient is a 55 year old male.  He has had recurrent ureteral stones.  He recently developed gross hematuria with recurrent right flank pain.  A CT scan revealed a 5-6 mm stone in the right mid ureter.  He has made several trips to the local emergency rooms for pain management.  He requested surgical intervention.  The risks and benefits of this were discussed with him at length.  Full informed consent was obtained.  DESCRIPTION OF PROCEDURE AND FINDINGS:  The patient was brought to the operating room where he had successful induction of general anesthesia.  He was placed in the _____ lithotomy position, and prepped and draped in the usual manner.  A cystoscopy revealed an unremarkable prostatic urethra and bladder.  Retrograde pyelography revealed a questionable small filling defect near the junction of the upper and mid ureter, but there did not appear to be a high grade obstruction at the time of the retrograde pyelogram.  A guidewire was placed in the right kidney without difficulty.  We were able initially to utilize rigid ureteroscopy.  We easily were able to enter the ureter, but approximately the mid ureter was unable to advance safely because of poor visualization distally.  For that reason, I went ahead and placed a ureteral dilating sheath in the standard manner over the guidewire.  A flexible ureteroscope was then  inserted.  We carefully inspected the calyceal system of the kidney because the patient had also had some gross hematuria, and found no other abnormalities or other stones.  Upon pulling the ureteroscope back into the upper ureter, we did encounter a 5-6 mm stone, partially imbedded in the wall of the ureter.  The holmium lithotriptor fiber was then placed.  A lithotripsy was accomplished and stone fractured very easily with a minimal number of shocks.  All these pieces appeared to be no larger than 1-2 mm.  One small fragment was obtained which will be sent for stone analysis.  Over the guidewire, a 24 cm 7-French double-J stent with a dangle string was placed.  The patient appeared to tolerate the procedure well and there were no obvious complications. DD:  09/13/00 TD:  09/14/00 Job: [id number]

## 2011-06-01 ENCOUNTER — Encounter: Payer: Self-pay | Admitting: Cardiovascular Disease

## 2011-07-23 ENCOUNTER — Encounter: Payer: Self-pay | Admitting: Gastroenterology

## 2011-08-10 ENCOUNTER — Other Ambulatory Visit: Payer: Self-pay | Admitting: Gastroenterology

## 2011-08-24 ENCOUNTER — Ambulatory Visit (AMBULATORY_SURGERY_CENTER): Payer: Self-pay

## 2011-08-24 VITALS — Ht 68.0 in | Wt 177.3 lb

## 2011-08-24 DIAGNOSIS — Z86010 Personal history of colonic polyps: Secondary | ICD-10-CM

## 2011-08-24 MED ORDER — PEG-KCL-NACL-NASULF-NA ASC-C 100 G PO SOLR: 1.0000 | Freq: Once | ORAL | Status: AC

## 2011-08-24 NOTE — Progress Notes (Signed)
Pt came into office today for his pre-visit prior to the colonoscopy on 09/07/11. Pt was offered propofol as sedation but refused at this time. Ulis Rias RN

## 2011-09-07 ENCOUNTER — Encounter: Payer: Self-pay | Admitting: Gastroenterology

## 2011-09-07 ENCOUNTER — Ambulatory Visit (AMBULATORY_SURGERY_CENTER): Payer: Self-pay | Admitting: Gastroenterology

## 2011-09-07 DIAGNOSIS — D126 Benign neoplasm of colon, unspecified: Secondary | ICD-10-CM

## 2011-09-07 DIAGNOSIS — Z1211 Encounter for screening for malignant neoplasm of colon: Secondary | ICD-10-CM

## 2011-09-07 DIAGNOSIS — Z86010 Personal history of colonic polyps: Secondary | ICD-10-CM

## 2011-09-07 MED ORDER — SODIUM CHLORIDE 0.9 % IV SOLN: 500.0000 mL | INTRAVENOUS | Status: DC

## 2011-09-07 NOTE — Patient Instructions (Signed)
Green and blue discharge instructions reviewed with patient and care partner.  Impressions/recommedations:  Polyps (handout given) Hemorrhoids (handout given)  May resume medications as you were taking them prior to your procedure.

## 2011-09-08 ENCOUNTER — Telehealth: Payer: Self-pay | Admitting: *Deleted

## 2011-09-08 NOTE — Telephone Encounter (Signed)

## 2011-09-13 ENCOUNTER — Encounter: Payer: Self-pay | Admitting: Gastroenterology

## 2015-11-18 ENCOUNTER — Encounter: Payer: Self-pay | Admitting: Gastroenterology

## 2016-02-03 DIAGNOSIS — I2510 Atherosclerotic heart disease of native coronary artery without angina pectoris: Secondary | ICD-10-CM | POA: Diagnosis not present

## 2016-02-10 ENCOUNTER — Other Ambulatory Visit: Payer: Self-pay

## 2016-02-19 DIAGNOSIS — M2550 Pain in unspecified joint: Secondary | ICD-10-CM | POA: Diagnosis not present

## 2016-02-19 DIAGNOSIS — R5383 Other fatigue: Secondary | ICD-10-CM | POA: Diagnosis not present

## 2016-02-25 DIAGNOSIS — D126 Benign neoplasm of colon, unspecified: Secondary | ICD-10-CM

## 2016-02-25 HISTORY — DX: Benign neoplasm of colon, unspecified: D12.6

## 2016-04-14 DIAGNOSIS — E663 Overweight: Secondary | ICD-10-CM | POA: Diagnosis not present

## 2016-04-14 DIAGNOSIS — Z6827 Body mass index (BMI) 27.0-27.9, adult: Secondary | ICD-10-CM | POA: Diagnosis not present

## 2016-04-14 DIAGNOSIS — F411 Generalized anxiety disorder: Secondary | ICD-10-CM | POA: Diagnosis not present

## 2016-04-14 DIAGNOSIS — Z13220 Encounter for screening for lipoid disorders: Secondary | ICD-10-CM | POA: Diagnosis not present

## 2016-04-14 DIAGNOSIS — Z125 Encounter for screening for malignant neoplasm of prostate: Secondary | ICD-10-CM | POA: Diagnosis not present

## 2016-04-14 DIAGNOSIS — Z0000 Encounter for general adult medical examination without abnormal findings: Secondary | ICD-10-CM | POA: Diagnosis not present

## 2016-04-14 DIAGNOSIS — Z131 Encounter for screening for diabetes mellitus: Secondary | ICD-10-CM | POA: Diagnosis not present

## 2016-04-14 DIAGNOSIS — Z79899 Other long term (current) drug therapy: Secondary | ICD-10-CM | POA: Diagnosis not present

## 2016-06-23 DIAGNOSIS — R51 Headache: Secondary | ICD-10-CM | POA: Diagnosis not present

## 2016-06-23 DIAGNOSIS — S46911A Strain of unspecified muscle, fascia and tendon at shoulder and upper arm level, right arm, initial encounter: Secondary | ICD-10-CM | POA: Diagnosis not present

## 2016-06-23 DIAGNOSIS — Z6826 Body mass index (BMI) 26.0-26.9, adult: Secondary | ICD-10-CM | POA: Diagnosis not present

## 2016-06-30 DIAGNOSIS — Z79899 Other long term (current) drug therapy: Secondary | ICD-10-CM | POA: Diagnosis not present

## 2016-06-30 DIAGNOSIS — Z6826 Body mass index (BMI) 26.0-26.9, adult: Secondary | ICD-10-CM | POA: Diagnosis not present

## 2016-06-30 DIAGNOSIS — R51 Headache: Secondary | ICD-10-CM | POA: Diagnosis not present

## 2016-08-17 ENCOUNTER — Encounter: Payer: Self-pay | Admitting: Gastroenterology

## 2016-08-31 DIAGNOSIS — F39 Unspecified mood [affective] disorder: Secondary | ICD-10-CM | POA: Diagnosis not present

## 2016-08-31 DIAGNOSIS — R51 Headache: Secondary | ICD-10-CM | POA: Diagnosis not present

## 2016-09-03 DIAGNOSIS — F39 Unspecified mood [affective] disorder: Secondary | ICD-10-CM | POA: Diagnosis not present

## 2016-09-03 DIAGNOSIS — R51 Headache: Secondary | ICD-10-CM | POA: Diagnosis not present

## 2016-09-24 DIAGNOSIS — R0603 Acute respiratory distress: Secondary | ICD-10-CM | POA: Diagnosis not present

## 2016-09-24 DIAGNOSIS — N402 Nodular prostate without lower urinary tract symptoms: Secondary | ICD-10-CM | POA: Diagnosis not present

## 2016-09-24 DIAGNOSIS — I10 Essential (primary) hypertension: Secondary | ICD-10-CM | POA: Diagnosis not present

## 2016-09-24 DIAGNOSIS — Z79899 Other long term (current) drug therapy: Secondary | ICD-10-CM | POA: Diagnosis not present

## 2016-09-24 DIAGNOSIS — I959 Hypotension, unspecified: Secondary | ICD-10-CM | POA: Diagnosis not present

## 2016-09-24 DIAGNOSIS — Z6826 Body mass index (BMI) 26.0-26.9, adult: Secondary | ICD-10-CM | POA: Diagnosis not present

## 2016-09-24 DIAGNOSIS — Z23 Encounter for immunization: Secondary | ICD-10-CM | POA: Diagnosis not present

## 2016-09-24 DIAGNOSIS — K219 Gastro-esophageal reflux disease without esophagitis: Secondary | ICD-10-CM | POA: Diagnosis not present

## 2016-09-24 DIAGNOSIS — K5909 Other constipation: Secondary | ICD-10-CM | POA: Diagnosis not present

## 2016-09-25 DIAGNOSIS — R0603 Acute respiratory distress: Secondary | ICD-10-CM | POA: Diagnosis not present

## 2016-09-27 DIAGNOSIS — E784 Other hyperlipidemia: Secondary | ICD-10-CM | POA: Diagnosis not present

## 2016-09-27 DIAGNOSIS — I2510 Atherosclerotic heart disease of native coronary artery without angina pectoris: Secondary | ICD-10-CM | POA: Diagnosis not present

## 2016-09-27 DIAGNOSIS — N400 Enlarged prostate without lower urinary tract symptoms: Secondary | ICD-10-CM | POA: Diagnosis not present

## 2016-09-27 DIAGNOSIS — F419 Anxiety disorder, unspecified: Secondary | ICD-10-CM | POA: Diagnosis not present

## 2016-09-27 DIAGNOSIS — E7800 Pure hypercholesterolemia, unspecified: Secondary | ICD-10-CM | POA: Diagnosis not present

## 2016-09-27 DIAGNOSIS — Z8249 Family history of ischemic heart disease and other diseases of the circulatory system: Secondary | ICD-10-CM | POA: Diagnosis not present

## 2016-09-27 DIAGNOSIS — F17211 Nicotine dependence, cigarettes, in remission: Secondary | ICD-10-CM | POA: Diagnosis not present

## 2016-09-27 DIAGNOSIS — R0602 Shortness of breath: Secondary | ICD-10-CM | POA: Diagnosis not present

## 2016-09-27 DIAGNOSIS — Z7982 Long term (current) use of aspirin: Secondary | ICD-10-CM | POA: Diagnosis not present

## 2016-09-27 DIAGNOSIS — Z79899 Other long term (current) drug therapy: Secondary | ICD-10-CM | POA: Diagnosis not present

## 2016-09-27 DIAGNOSIS — I252 Old myocardial infarction: Secondary | ICD-10-CM | POA: Diagnosis not present

## 2016-09-27 DIAGNOSIS — I10 Essential (primary) hypertension: Secondary | ICD-10-CM | POA: Diagnosis not present

## 2016-09-27 DIAGNOSIS — R0789 Other chest pain: Secondary | ICD-10-CM | POA: Diagnosis not present

## 2016-09-27 DIAGNOSIS — I200 Unstable angina: Secondary | ICD-10-CM | POA: Diagnosis not present

## 2016-09-27 DIAGNOSIS — R0989 Other specified symptoms and signs involving the circulatory and respiratory systems: Secondary | ICD-10-CM | POA: Diagnosis not present

## 2016-09-27 DIAGNOSIS — G8929 Other chronic pain: Secondary | ICD-10-CM | POA: Diagnosis not present

## 2016-09-27 DIAGNOSIS — E785 Hyperlipidemia, unspecified: Secondary | ICD-10-CM | POA: Diagnosis not present

## 2016-09-27 DIAGNOSIS — R079 Chest pain, unspecified: Secondary | ICD-10-CM | POA: Diagnosis not present

## 2016-09-27 DIAGNOSIS — M545 Low back pain: Secondary | ICD-10-CM | POA: Diagnosis not present

## 2016-09-27 DIAGNOSIS — I119 Hypertensive heart disease without heart failure: Secondary | ICD-10-CM | POA: Diagnosis not present

## 2016-09-28 DIAGNOSIS — I10 Essential (primary) hypertension: Secondary | ICD-10-CM | POA: Diagnosis not present

## 2016-09-28 DIAGNOSIS — E785 Hyperlipidemia, unspecified: Secondary | ICD-10-CM | POA: Diagnosis not present

## 2016-09-28 DIAGNOSIS — R079 Chest pain, unspecified: Secondary | ICD-10-CM | POA: Diagnosis not present

## 2016-09-28 DIAGNOSIS — I2510 Atherosclerotic heart disease of native coronary artery without angina pectoris: Secondary | ICD-10-CM | POA: Diagnosis not present

## 2016-09-29 DIAGNOSIS — N402 Nodular prostate without lower urinary tract symptoms: Secondary | ICD-10-CM | POA: Diagnosis not present

## 2016-10-05 DIAGNOSIS — R51 Headache: Secondary | ICD-10-CM | POA: Diagnosis not present

## 2016-10-05 DIAGNOSIS — G44209 Tension-type headache, unspecified, not intractable: Secondary | ICD-10-CM | POA: Diagnosis not present

## 2016-10-05 DIAGNOSIS — G8929 Other chronic pain: Secondary | ICD-10-CM | POA: Diagnosis not present

## 2016-10-05 DIAGNOSIS — Z6826 Body mass index (BMI) 26.0-26.9, adult: Secondary | ICD-10-CM | POA: Diagnosis not present

## 2016-10-05 DIAGNOSIS — M5441 Lumbago with sciatica, right side: Secondary | ICD-10-CM | POA: Diagnosis not present

## 2016-10-20 DIAGNOSIS — I2510 Atherosclerotic heart disease of native coronary artery without angina pectoris: Secondary | ICD-10-CM | POA: Diagnosis not present

## 2016-10-20 DIAGNOSIS — Z09 Encounter for follow-up examination after completed treatment for conditions other than malignant neoplasm: Secondary | ICD-10-CM | POA: Diagnosis not present

## 2016-10-20 DIAGNOSIS — Z6827 Body mass index (BMI) 27.0-27.9, adult: Secondary | ICD-10-CM | POA: Diagnosis not present

## 2016-10-20 DIAGNOSIS — Z79899 Other long term (current) drug therapy: Secondary | ICD-10-CM | POA: Diagnosis not present

## 2016-11-22 DIAGNOSIS — M4726 Other spondylosis with radiculopathy, lumbar region: Secondary | ICD-10-CM | POA: Diagnosis not present

## 2016-11-22 DIAGNOSIS — M5136 Other intervertebral disc degeneration, lumbar region: Secondary | ICD-10-CM | POA: Diagnosis not present

## 2016-11-22 DIAGNOSIS — G8929 Other chronic pain: Secondary | ICD-10-CM | POA: Diagnosis not present

## 2016-11-22 DIAGNOSIS — M5441 Lumbago with sciatica, right side: Secondary | ICD-10-CM | POA: Diagnosis not present

## 2016-12-13 DIAGNOSIS — Z6827 Body mass index (BMI) 27.0-27.9, adult: Secondary | ICD-10-CM | POA: Diagnosis not present

## 2016-12-13 DIAGNOSIS — N200 Calculus of kidney: Secondary | ICD-10-CM | POA: Diagnosis not present

## 2016-12-13 DIAGNOSIS — R109 Unspecified abdominal pain: Secondary | ICD-10-CM | POA: Diagnosis not present

## 2017-01-03 DIAGNOSIS — H538 Other visual disturbances: Secondary | ICD-10-CM | POA: Diagnosis not present

## 2017-02-02 DIAGNOSIS — F419 Anxiety disorder, unspecified: Secondary | ICD-10-CM | POA: Diagnosis not present

## 2017-05-05 DIAGNOSIS — Z131 Encounter for screening for diabetes mellitus: Secondary | ICD-10-CM | POA: Diagnosis not present

## 2017-05-05 DIAGNOSIS — Z79899 Other long term (current) drug therapy: Secondary | ICD-10-CM | POA: Diagnosis not present

## 2017-05-05 DIAGNOSIS — Z13220 Encounter for screening for lipoid disorders: Secondary | ICD-10-CM | POA: Diagnosis not present

## 2017-05-05 DIAGNOSIS — I2510 Atherosclerotic heart disease of native coronary artery without angina pectoris: Secondary | ICD-10-CM | POA: Diagnosis not present

## 2017-05-05 DIAGNOSIS — Z0000 Encounter for general adult medical examination without abnormal findings: Secondary | ICD-10-CM | POA: Diagnosis not present

## 2017-05-05 DIAGNOSIS — Z125 Encounter for screening for malignant neoplasm of prostate: Secondary | ICD-10-CM | POA: Diagnosis not present

## 2017-05-05 DIAGNOSIS — Z6829 Body mass index (BMI) 29.0-29.9, adult: Secondary | ICD-10-CM | POA: Diagnosis not present

## 2017-06-15 ENCOUNTER — Ambulatory Visit (INDEPENDENT_AMBULATORY_CARE_PROVIDER_SITE_OTHER): Payer: Medicare Other | Admitting: Cardiology

## 2017-06-15 ENCOUNTER — Encounter: Payer: Self-pay | Admitting: Cardiology

## 2017-06-15 VITALS — BP 140/80 | HR 77 | Ht 69.0 in | Wt 183.0 lb

## 2017-06-15 DIAGNOSIS — I10 Essential (primary) hypertension: Secondary | ICD-10-CM

## 2017-06-15 DIAGNOSIS — E782 Mixed hyperlipidemia: Secondary | ICD-10-CM | POA: Diagnosis not present

## 2017-06-15 DIAGNOSIS — I2510 Atherosclerotic heart disease of native coronary artery without angina pectoris: Secondary | ICD-10-CM

## 2017-06-15 HISTORY — DX: Atherosclerotic heart disease of native coronary artery without angina pectoris: I25.10

## 2017-06-15 NOTE — Patient Instructions (Signed)
Medication Instructions:  Your physician recommends that you continue on your current medications as directed. Please refer to the Current Medication list given to you today.   Labwork:  None  Testing/Procedures: None  Follow-Up: Your physician recommends that you schedule a follow-up appointment in: In 6 Months with Dr. Geraldo Pitter.   Any Other Special Instructions Will Be Listed Below (If Applicable).     If you need a refill on your cardiac medications before your next appointment, please call your pharmacy.

## 2017-06-15 NOTE — Progress Notes (Signed)
Cardiology Office Note:    Date:  06/15/2017   ID:  Brandon Wong, DOB 07/10/1956, MRN [id number]  PCP:  Cassandria Anger, MD  Cardiologist:  Jenean Lindau, MD   Referring MD: Cassandria Anger, MD     History of Present Illness:    Brandon Wong is a 61 y.o. male who is being seen today for the evaluation of Known coronary artery disease. Patient is here for routine follow-up appointment. He denies any complaints at this time and takes care of activities of daily living. No chest pain orthopnea or PND. At the time of my evaluation he is alert and oriented status. He is very active gentleman and walks a mile pulmonary atelectasis to 5 times a week and symptoms.  Past Medical History:  Diagnosis Date  . Anxiety and depression   . Chronic headache   . Degenerative disc disease, cervical   . Dyslipidemia   . External hemorrhoids   . Family history of acute myocardial infarction   . Gastritis   . GERD (gastroesophageal reflux disease)   . History of colonic polyps   . Hypertension   . Impingement syndrome of right shoulder   . Lateral epicondylitis  of elbow   . Low back pain   . Meralgia paresthetica   . MRSA (methicillin resistant Staphylococcus aureus) 2009   Skin  . Osteoarthritis   . Psoriasis    Possible psoriatic arthritis 2010  . Renal calculus   . Sleep disorder     Past Surgical History:  Procedure Laterality Date  . COLONOSCOPY    . FINGER SURGERY    . POLYPECTOMY    . ROTATOR CUFF REPAIR     right    Current Medications: Current Meds  Medication Sig  . aspirin EC 81 MG tablet Take 81 mg by mouth daily.  Marland Kitchen atorvastatin (LIPITOR) 20 MG tablet Take 20 mg by mouth daily.  Marland Kitchen ibuprofen (ADVIL,MOTRIN) 600 MG tablet Take 600 mg by mouth 2 (two) times daily after a meal.    . nitroGLYCERIN (NITROSTAT) 0.4 MG SL tablet Place 0.4 mg under the tongue every 5 (five) minutes as needed.  . tamsulosin (FLOMAX) 0.4 MG CAPS capsule Take 0.4 mg by mouth  daily.     Allergies:   Patient has no known allergies.   Social History   Social History  . Marital status: Single    Spouse name: N/A  . Number of children: N/A  . Years of education: N/A   Social History Main Topics  . Smoking status: Former Smoker    Packs/day: 0.50    Types: Cigarettes    Quit date: 06/15/2012  . Smokeless tobacco: Never Used  . Alcohol use No  . Drug use: No  . Sexual activity: Not Asked   Other Topics Concern  . None   Social History Narrative   Getting married.    Family History:  The patient's family history includes Heart disease in his mother.  ROS:   Please see the history of present illness.    All other systems reviewed and are negative.  EKGs/Labs/Other Studies Reviewed:    The following studies were reviewed today: Reviewed stress test report and CT scan report from [facility] done several months ago and discussed the report with the patient.   Recent Labs: No results found for requested labs within last 8760 hours.  Recent Lipid Panel    Component Value Date/Time   CHOL 172 05/30/2007 0918  TRIG 76 05/30/2007 0918   HDL 26.5 (L) 05/30/2007 0918   CHOLHDL 6.5 CALC 05/30/2007 0918   VLDL 15 05/30/2007 0918   LDLCALC 130 (H) 05/30/2007 0918    Physical Exam:    VS:  BP 140/80   Pulse 77   Ht 5\' 9"  (1.753 m)   Wt 183 lb (83 kg)   SpO2 98%   BMI 27.02 kg/m     Wt Readings from Last 3 Encounters:  06/15/17 183 lb (83 kg)  09/07/11 177 lb (80.3 kg)  08/24/11 177 lb 4.8 oz (80.4 kg)     GEN: Patient is in no acute distress HEENT: Normal NECK: No JVD; No carotid bruits LYMPHATICS: No lymphadenopathy CARDIAC: 2/6 systolic murmur at the apex. RESPIRATORY:  Clear to auscultation without rales, wheezing or rhonchi  ABDOMEN: Soft, non-tender, non-distended MUSCULOSKELETAL:  No edema; No deformity  SKIN: Warm and dry NEUROLOGIC:  Alert and oriented x 3 PSYCHIATRIC:  Normal affect   ASSESSMENT:    1.  Essential hypertension   2. Mixed dyslipidemia   3. Coronary artery disease involving native coronary artery of native heart without angina pectoris    PLAN:    In order of problems listed above:  1. Secondary prevention stressed to patient. Importance of compliance with diet and medications stressed. On the basis is clinically stable and the patient is asymptomatic with good effort tolerance review of records from [place] including stress test and CT scan were discussed with the patient. His blood pressure is stable. He mentions to me that his lipids are followed by his primary care physician. He will be seen in follow up appointment in 6 months. 3.   Medication Adjustments/Labs and Tests Ordered: Current medicines are reviewed at length with the patient today.  Concerns regarding medicines are outlined above.    Signed, Jenean Lindau, MD  06/15/2017 10:45 AM    [place]

## 2017-07-13 DIAGNOSIS — Z6829 Body mass index (BMI) 29.0-29.9, adult: Secondary | ICD-10-CM | POA: Diagnosis not present

## 2017-07-13 DIAGNOSIS — S46911A Strain of unspecified muscle, fascia and tendon at shoulder and upper arm level, right arm, initial encounter: Secondary | ICD-10-CM | POA: Diagnosis not present

## 2017-07-13 DIAGNOSIS — Z1389 Encounter for screening for other disorder: Secondary | ICD-10-CM | POA: Diagnosis not present

## 2017-07-13 DIAGNOSIS — Z79899 Other long term (current) drug therapy: Secondary | ICD-10-CM | POA: Diagnosis not present

## 2017-07-13 DIAGNOSIS — E663 Overweight: Secondary | ICD-10-CM | POA: Diagnosis not present

## 2017-07-13 DIAGNOSIS — M159 Polyosteoarthritis, unspecified: Secondary | ICD-10-CM | POA: Diagnosis not present

## 2017-08-02 DIAGNOSIS — Z23 Encounter for immunization: Secondary | ICD-10-CM | POA: Diagnosis not present

## 2017-08-04 DIAGNOSIS — M722 Plantar fascial fibromatosis: Secondary | ICD-10-CM | POA: Insufficient documentation

## 2017-08-04 DIAGNOSIS — M7670 Peroneal tendinitis, unspecified leg: Secondary | ICD-10-CM | POA: Diagnosis not present

## 2017-08-04 HISTORY — DX: Plantar fascial fibromatosis: M72.2

## 2017-09-07 ENCOUNTER — Other Ambulatory Visit: Payer: Self-pay

## 2017-09-07 DIAGNOSIS — I2510 Atherosclerotic heart disease of native coronary artery without angina pectoris: Secondary | ICD-10-CM

## 2017-09-07 MED ORDER — NITROGLYCERIN 0.4 MG SL SUBL: 0.4000 mg | SUBLINGUAL_TABLET | SUBLINGUAL | 6 refills | Status: DC | PRN

## 2017-09-14 ENCOUNTER — Other Ambulatory Visit: Payer: Self-pay

## 2017-09-14 DIAGNOSIS — E782 Mixed hyperlipidemia: Secondary | ICD-10-CM

## 2017-09-14 MED ORDER — ATORVASTATIN CALCIUM 20 MG PO TABS: 20.0000 mg | ORAL_TABLET | Freq: Every day | ORAL | 3 refills | Status: DC

## 2017-10-04 DIAGNOSIS — R51 Headache: Secondary | ICD-10-CM | POA: Diagnosis not present

## 2017-10-04 DIAGNOSIS — R42 Dizziness and giddiness: Secondary | ICD-10-CM | POA: Diagnosis not present

## 2017-10-04 DIAGNOSIS — Z6828 Body mass index (BMI) 28.0-28.9, adult: Secondary | ICD-10-CM | POA: Diagnosis not present

## 2017-11-03 DIAGNOSIS — Z6828 Body mass index (BMI) 28.0-28.9, adult: Secondary | ICD-10-CM | POA: Diagnosis not present

## 2017-11-03 DIAGNOSIS — J45901 Unspecified asthma with (acute) exacerbation: Secondary | ICD-10-CM | POA: Diagnosis not present

## 2017-11-03 DIAGNOSIS — Z79899 Other long term (current) drug therapy: Secondary | ICD-10-CM | POA: Diagnosis not present

## 2017-11-24 DIAGNOSIS — R070 Pain in throat: Secondary | ICD-10-CM | POA: Diagnosis not present

## 2017-11-24 DIAGNOSIS — Z6828 Body mass index (BMI) 28.0-28.9, adult: Secondary | ICD-10-CM | POA: Diagnosis not present

## 2017-12-20 ENCOUNTER — Ambulatory Visit (INDEPENDENT_AMBULATORY_CARE_PROVIDER_SITE_OTHER): Payer: Medicare Other | Admitting: Cardiology

## 2017-12-20 ENCOUNTER — Encounter: Payer: Self-pay | Admitting: Cardiology

## 2017-12-20 VITALS — BP 150/98 | HR 77 | Ht 69.0 in | Wt 192.0 lb

## 2017-12-20 DIAGNOSIS — I10 Essential (primary) hypertension: Secondary | ICD-10-CM | POA: Diagnosis not present

## 2017-12-20 DIAGNOSIS — I2510 Atherosclerotic heart disease of native coronary artery without angina pectoris: Secondary | ICD-10-CM

## 2017-12-20 DIAGNOSIS — Z1329 Encounter for screening for other suspected endocrine disorder: Secondary | ICD-10-CM | POA: Diagnosis not present

## 2017-12-20 MED ORDER — LISINOPRIL 10 MG PO TABS: 10.0000 mg | ORAL_TABLET | Freq: Every day | ORAL | 1 refills | Status: DC

## 2017-12-20 MED ORDER — NITROGLYCERIN 0.4 MG SL SUBL: 0.4000 mg | SUBLINGUAL_TABLET | SUBLINGUAL | 6 refills | Status: DC | PRN

## 2017-12-20 NOTE — Progress Notes (Signed)
Cardiology Office Note:    Date:  12/20/2017   ID:  Brandon Wong, DOB [birth year], MRN [id number]  PCP:  Cassandria Anger, MD  Cardiologist:  Jenean Lindau, MD   Referring MD: Cassandria Anger, MD    ASSESSMENT:    1. Coronary artery disease involving native heart without angina pectoris, unspecified vessel or lesion type   2. Screening for thyroid disorder    PLAN:    In order of problems listed above:  1. Secondary prevention stressed with the patient.  As of compliance with diet and medications stressed he vocalized understanding. 2. He will get his lipids checked today.  He has been following a good diet and exercising. 3. His blood pressure at the end of the visit was 140/110.  He will send [place] his blood pressure readings in 1 week.  He will be seen in follow-up appointment in 6 months or earlier if he has any concerns.  Sublingual nitroglycerin prescription was sent, its protocol and 911 protocol explained and the patient vocalized understanding questions were answered to the patient's satisfaction   Medication Adjustments/Labs and Tests Ordered: Current medicines are reviewed at length with the patient today.  Concerns regarding medicines are outlined above.  Orders Placed This Encounter  Procedures  . Basic metabolic panel  . TSH  . Hepatic function panel  . Lipid panel  . CBC with Differential/Platelet  . EKG 12-Lead   Meds ordered this encounter  Medications  . nitroGLYCERIN (NITROSTAT) 0.4 MG SL tablet    Sig: Place 1 tablet (0.4 mg total) under the tongue every 5 (five) minutes as needed.    Dispense:  25 tablet    Refill:  6     Chief Complaint  Patient presents with  . Follow-up     History of Present Illness:    Brandon Wong is a 62 y.o. male.  Patient has history of coronary artery disease and essential hypertension and dyslipidemia.  He denies any problems at this time and takes care of activities of daily living.  No chest pain  orthopnea or PND.  Patient is here for routine follow-up.  He walks on a regular basis.  He denies any symptoms from a cardiovascular standpoint.  He is also come fasting today as he wants his blood work to be done.  Past Medical History:  Diagnosis Date  . Anxiety and depression   . Chronic headache   . Degenerative disc disease, cervical   . Dyslipidemia   . External hemorrhoids   . Family history of acute myocardial infarction   . Gastritis   . GERD (gastroesophageal reflux disease)   . History of colonic polyps   . Hypertension   . Impingement syndrome of right shoulder   . Lateral epicondylitis  of elbow   . Low back pain   . Meralgia paresthetica   . MRSA (methicillin resistant Staphylococcus aureus) 2009   Skin  . Osteoarthritis   . Psoriasis    Possible psoriatic arthritis 2010  . Renal calculus   . Sleep disorder     Past Surgical History:  Procedure Laterality Date  . COLONOSCOPY    . FINGER SURGERY    . POLYPECTOMY    . ROTATOR CUFF REPAIR     right    Current Medications: Current Meds  Medication Sig  . aspirin EC 81 MG tablet Take 81 mg by mouth daily.  Marland Kitchen atorvastatin (LIPITOR) 20 MG tablet Take 1 tablet (20 mg total)  by mouth daily.  . nitroGLYCERIN (NITROSTAT) 0.4 MG SL tablet Place 1 tablet (0.4 mg total) under the tongue every 5 (five) minutes as needed.  . tamsulosin (FLOMAX) 0.4 MG CAPS capsule Take 0.4 mg by mouth daily.  . [DISCONTINUED] nitroGLYCERIN (NITROSTAT) 0.4 MG SL tablet Place 1 tablet (0.4 mg total) under the tongue every 5 (five) minutes as needed.     Allergies:   Patient has no known allergies.   Social History   Socioeconomic History  . Marital status: Single    Spouse name: None  . Number of children: None  . Years of education: None  . Highest education level: None  Social Needs  . Financial resource strain: None  . Food insecurity - worry: None  . Food insecurity - inability: None  . Transportation needs - medical: None   . Transportation needs - non-medical: None  Occupational History  . None  Tobacco Use  . Smoking status: Former Smoker    Packs/day: 0.50    Types: Cigarettes    Last attempt to quit: 06/15/2012    Years since quitting: 5.5  . Smokeless tobacco: Never Used  Substance and Sexual Activity  . Alcohol use: No  . Drug use: No  . Sexual activity: None  Other Topics Concern  . None  Social History Narrative   Getting married.     Family History: The patient's family history includes Heart disease in his mother.  ROS:   Please see the history of present illness.    All other systems reviewed and are negative.  EKGs/Labs/Other Studies Reviewed:    The following studies were reviewed today: I reviewed EKG done today and this reveals sinus rhythm and was within normal limits.   Recent Labs: No results found for requested labs within last 8760 hours.  Recent Lipid Panel    Component Value Date/Time   CHOL 172 05/30/2007 0918   TRIG 76 05/30/2007 0918   HDL 26.5 (L) 05/30/2007 0918   CHOLHDL 6.5 CALC 05/30/2007 0918   VLDL 15 05/30/2007 0918   LDLCALC 130 (H) 05/30/2007 0918    Physical Exam:    VS:  BP (!) 128/100 (BP Location: Left Arm, Patient Position: Sitting, Cuff Size: Normal)   Pulse 77   Ht 5\' 9"  (1.753 m)   Wt 192 lb (87.1 kg)   SpO2 98%   BMI 28.35 kg/m     Wt Readings from Last 3 Encounters:  12/20/17 192 lb (87.1 kg)  06/15/17 183 lb (83 kg)  09/07/11 177 lb (80.3 kg)     GEN: Patient is in no acute distress HEENT: Normal NECK: No JVD; No carotid bruits LYMPHATICS: No lymphadenopathy CARDIAC: Hear sounds regular, 2/6 systolic murmur at the apex. RESPIRATORY:  Clear to auscultation without rales, wheezing or rhonchi  ABDOMEN: Soft, non-tender, non-distended MUSCULOSKELETAL:  No edema; No deformity  SKIN: Warm and dry NEUROLOGIC:  Alert and oriented x 3 PSYCHIATRIC:  Normal affect   Signed, Jenean Lindau, MD  12/20/2017 9:36 AM    [place]

## 2017-12-20 NOTE — Patient Instructions (Addendum)
Medication Instructions:  Your physician has recommended you make the following change in your medication:  START Nitroglycerin 0.4 mg sublingual (under your tongue) as needed for chest pain. If experiencing chest pain, stop what you are doing and sit down. Take 1 nitroglycerin and wait 5 minutes. If chest pain continues, take another nitroglycerin and wait 5 minutes. If chest pain does not subside, take 1 more nitroglycerin and dial 911. You make take a total of 3 nitroglycerin in a 15 minute time frame.  START lisinopril 10 mg daily  Labwork: Your physician recommends that you have the following labs drawn: CBC, BMP, TSH and liver/lipid panel  Testing/Procedures: None  Follow-Up: Your physician recommends that you schedule a follow-up appointment in: 7 months  Any Other Special Instructions Will Be Listed Below (If Applicable).  Please log your blood pressure twice daily for two weeks. Take blood pressure one hour after taking your medication. Bring this log with your for your nurse visit.   If you need a refill on your cardiac medications before your next appointment, please call your pharmacy.   [place], RN, BSN  Lisinopril tablets What is this medicine? LISINOPRIL (lyse IN oh pril) is an ACE inhibitor. This medicine is used to treat high blood pressure and heart failure. It is also used to protect the heart immediately after a heart attack. This medicine may be used for other purposes; ask your health care provider or pharmacist if you have questions. COMMON BRAND NAME(S): Prinivil, Zestril What should I tell my health care provider before I take this medicine? They need to know if you have any of these conditions: -diabetes -heart or blood vessel disease -kidney disease -low blood pressure -previous swelling of the tongue, face, or lips with difficulty breathing, difficulty swallowing, hoarseness, or tightening of the throat -an unusual or allergic  reaction to lisinopril, other ACE inhibitors, insect venom, foods, dyes, or preservatives -pregnant or trying to get pregnant -breast-feeding How should I use this medicine? Take this medicine by mouth with a glass of water. Follow the directions on your prescription label. You may take this medicine with or without food. If it upsets your stomach, take it with food. Take your medicine at regular intervals. Do not take it more often than directed. Do not stop taking except on your doctor's advice. Talk to your pediatrician regarding the use of this medicine in children. Special care may be needed. While this drug may be prescribed for children as young as 28 years of age for selected conditions, precautions do apply. Overdosage: If you think you have taken too much of this medicine contact a poison control center or emergency room at once. NOTE: This medicine is only for you. Do not share this medicine with others. What if I miss a dose? If you miss a dose, take it as soon as you can. If it is almost time for your next dose, take only that dose. Do not take double or extra doses. What may interact with this medicine? Do not take this medicine with any of the following medications: -hymenoptera venom -sacubitril; valsartan This medicines may also interact with the following medications: -aliskiren -angiotensin receptor blockers, like losartan or valsartan -certain medicines for diabetes -diuretics -everolimus -gold compounds -lithium -NSAIDs, medicines for pain and inflammation, like ibuprofen or naproxen -potassium salts or supplements -salt substitutes -sirolimus -temsirolimus This list may not describe all possible interactions. Give your health care provider a list of all the medicines, herbs,  non-prescription drugs, or dietary supplements you use. Also tell them if you smoke, drink alcohol, or use illegal drugs. Some items may interact with your medicine. What should I watch for while  using this medicine? Visit your doctor or health care professional for regular check ups. Check your blood pressure as directed. Ask your doctor what your blood pressure should be, and when you should contact him or her. Do not treat yourself for coughs, colds, or pain while you are using this medicine without asking your doctor or health care professional for advice. Some ingredients may increase your blood pressure. Women should inform their doctor if they wish to become pregnant or think they might be pregnant. There is a potential for serious side effects to an unborn child. Talk to your health care professional or pharmacist for more information. Check with your doctor or health care professional if you get an attack of severe diarrhea, nausea and vomiting, or if you sweat a lot. The loss of too much body fluid can make it dangerous for you to take this medicine. You may get drowsy or dizzy. Do not drive, use machinery, or do anything that needs mental alertness until you know how this drug affects you. Do not stand or sit up quickly, especially if you are an older patient. This reduces the risk of dizzy or fainting spells. Alcohol can make you more drowsy and dizzy. Avoid alcoholic drinks. Avoid salt substitutes unless you are told otherwise by your doctor or health care professional. What side effects may I notice from receiving this medicine? Side effects that you should report to your doctor or health care professional as soon as possible: -allergic reactions like skin rash, itching or hives, swelling of the hands, feet, face, lips, throat, or tongue -breathing problems -signs and symptoms of kidney injury like trouble passing urine or change in the amount of urine -signs and symptoms of increased potassium like muscle weakness; chest pain; or fast, irregular heartbeat -signs and symptoms of liver injury like dark yellow or brown urine; general ill feeling or flu-like symptoms; light-colored  stools; loss of appetite; nausea; right upper belly pain; unusually weak or tired; yellowing of the eyes or skin -signs and symptoms of low blood pressure like dizziness; feeling faint or lightheaded, falls; unusually weak or tired -stomach pain with or without nausea and vomiting Side effects that usually do not require medical attention (report to your doctor or health care professional if they continue or are bothersome): -changes in taste -cough -dizziness -fever -headache -sensitivity to light This list may not describe all possible side effects. Call your doctor for medical advice about side effects. You may report side effects to FDA at 1-800-FDA-1088. Where should I keep my medicine? Keep out of the reach of children. Store at room temperature between 15 and 30 degrees C (59 and 86 degrees F). Protect from moisture. Keep container tightly closed. Throw away any unused medicine after the expiration date. NOTE: This sheet is a summary. It may not cover all possible information. If you have questions about this medicine, talk to your doctor, pharmacist, or health care provider.  2018 Elsevier/Gold Standard (2016-01-12 12:52:35)  Nitroglycerin sublingual tablets What is this medicine? NITROGLYCERIN (nye troe GLI ser in) is a type of vasodilator. It relaxes blood vessels, increasing the blood and oxygen supply to your heart. This medicine is used to relieve chest pain caused by angina. It is also used to prevent chest pain before activities like climbing stairs, going outdoors  in cold weather, or sexual activity. This medicine may be used for other purposes; ask your health care provider or pharmacist if you have questions. COMMON BRAND NAME(S): Nitroquick, Nitrostat, Nitrotab What should I tell my health care provider before I take this medicine? They need to know if you have any of these conditions: -anemia -head injury, recent stroke, or bleeding in the brain -liver  disease -previous heart attack -an unusual or allergic reaction to nitroglycerin, other medicines, foods, dyes, or preservatives -pregnant or trying to get pregnant -breast-feeding How should I use this medicine? Take this medicine by mouth as needed. At the first sign of an angina attack (chest pain or tightness) place one tablet under your tongue. You can also take this medicine 5 to 10 minutes before an event likely to produce chest pain. Follow the directions on the prescription label. Let the tablet dissolve under the tongue. Do not swallow whole. Replace the dose if you accidentally swallow it. It will help if your mouth is not dry. Saliva around the tablet will help it to dissolve more quickly. Do not eat or drink, smoke or chew tobacco while a tablet is dissolving. If you are not better within 5 minutes after taking ONE dose of nitroglycerin, call 9-1-1 immediately to seek emergency medical care. Do not take more than 3 nitroglycerin tablets over 15 minutes. If you take this medicine often to relieve symptoms of angina, your doctor or health care professional may provide you with different instructions to manage your symptoms. If symptoms do not go away after following these instructions, it is important to call 9-1-1 immediately. Do not take more than 3 nitroglycerin tablets over 15 minutes. Talk to your pediatrician regarding the use of this medicine in children. Special care may be needed. Overdosage: If you think you have taken too much of this medicine contact a poison control center or emergency room at once. NOTE: This medicine is only for you. Do not share this medicine with others. What if I miss a dose? This does not apply. This medicine is only used as needed. What may interact with this medicine? Do not take this medicine with any of the following medications: -certain migraine medicines like ergotamine and dihydroergotamine (DHE) -medicines used to treat erectile dysfunction like  sildenafil, tadalafil, and vardenafil -riociguat This medicine may also interact with the following medications: -alteplase -aspirin -heparin -medicines for high blood pressure -medicines for mental depression -other medicines used to treat angina -phenothiazines like chlorpromazine, mesoridazine, prochlorperazine, thioridazine This list may not describe all possible interactions. Give your health care provider a list of all the medicines, herbs, non-prescription drugs, or dietary supplements you use. Also tell them if you smoke, drink alcohol, or use illegal drugs. Some items may interact with your medicine. What should I watch for while using this medicine? Tell your doctor or health care professional if you feel your medicine is no longer working. Keep this medicine with you at all times. Sit or lie down when you take your medicine to prevent falling if you feel dizzy or faint after using it. Try to remain calm. This will help you to feel better faster. If you feel dizzy, take several deep breaths and lie down with your feet propped up, or bend forward with your head resting between your knees. You may get drowsy or dizzy. Do not drive, use machinery, or do anything that needs mental alertness until you know how this drug affects you. Do not stand or sit up quickly,  especially if you are an older patient. This reduces the risk of dizzy or fainting spells. Alcohol can make you more drowsy and dizzy. Avoid alcoholic drinks. Do not treat yourself for coughs, colds, or pain while you are taking this medicine without asking your doctor or health care professional for advice. Some ingredients may increase your blood pressure. What side effects may I notice from receiving this medicine? Side effects that you should report to your doctor or health care professional as soon as possible: -blurred vision -dry mouth -skin rash -sweating -the feeling of extreme pressure in the head -unusually weak or  tired Side effects that usually do not require medical attention (report to your doctor or health care professional if they continue or are bothersome): -flushing of the face or neck -headache -irregular heartbeat, palpitations -nausea, vomiting This list may not describe all possible side effects. Call your doctor for medical advice about side effects. You may report side effects to FDA at 1-800-FDA-1088. Where should I keep my medicine? Keep out of the reach of children. Store at room temperature between 20 and 25 degrees C (68 and 77 degrees F). Store in [employer]. Protect from light and moisture. Keep tightly closed. Throw away any unused medicine after the expiration date. NOTE: This sheet is a summary. It may not cover all possible information. If you have questions about this medicine, talk to your doctor, pharmacist, or health care provider.  2018 Elsevier/Gold Standard (2013-09-20 17:57:36)

## 2017-12-21 LAB — CBC WITH DIFFERENTIAL/PLATELET
BASOS ABS: 0 10*3/uL (ref 0.0–0.2)
Basos: 0 %
EOS (ABSOLUTE): 0.1 10*3/uL (ref 0.0–0.4)
Eos: 1 %
HEMOGLOBIN: 14.8 g/dL (ref 13.0–17.7)
Hematocrit: 44.6 % (ref 37.5–51.0)
Immature Grans (Abs): 0 10*3/uL (ref 0.0–0.1)
Immature Granulocytes: 0 %
LYMPHS ABS: 1.7 10*3/uL (ref 0.7–3.1)
LYMPHS: 24 %
MCH: 29.8 pg (ref 26.6–33.0)
MCHC: 33.2 g/dL (ref 31.5–35.7)
MCV: 90 fL (ref 79–97)
MONOCYTES: 11 %
Monocytes Absolute: 0.8 10*3/uL (ref 0.1–0.9)
Neutrophils Absolute: 4.6 10*3/uL (ref 1.4–7.0)
Neutrophils: 64 %
PLATELETS: 283 10*3/uL (ref 150–379)
RBC: 4.97 x10E6/uL (ref 4.14–5.80)
RDW: 14 % (ref 12.3–15.4)
WBC: 7.2 10*3/uL (ref 3.4–10.8)

## 2017-12-21 LAB — BASIC METABOLIC PANEL
BUN/Creatinine Ratio: 9 (ref 9–20)
BUN: 9 mg/dL (ref 6–24)
CALCIUM: 10.1 mg/dL (ref 8.7–10.2)
CO2: 29 mmol/L (ref 20–29)
CREATININE: 0.95 mg/dL (ref 0.76–1.27)
Chloride: 101 mmol/L (ref 96–106)
GFR, EST AFRICAN AMERICAN: 101 mL/min/{1.73_m2} (ref >59)
GFR, EST NON AFRICAN AMERICAN: 87 mL/min/{1.73_m2} (ref >59)
Glucose: 89 mg/dL (ref 65–99)
Potassium: 5.2 mmol/L (ref 3.5–5.2)
Sodium: 143 mmol/L (ref 134–144)

## 2017-12-21 LAB — HEPATIC FUNCTION PANEL
ALBUMIN: 4.6 g/dL (ref 3.5–5.5)
ALK PHOS: 84 IU/L (ref 39–117)
ALT: 26 IU/L (ref 0–44)
AST: 16 IU/L (ref 0–40)
BILIRUBIN TOTAL: 0.2 mg/dL (ref 0.0–1.2)
Bilirubin, Direct: 0.07 mg/dL (ref 0.00–0.40)
Total Protein: 6.9 g/dL (ref 6.0–8.5)

## 2017-12-21 LAB — LIPID PANEL
CHOL/HDL RATIO: 2.8 ratio (ref 0.0–5.0)
Cholesterol, Total: 128 mg/dL (ref 100–199)
HDL: 46 mg/dL (ref >39)
LDL Calculated: 66 mg/dL (ref 0–99)
Triglycerides: 79 mg/dL (ref 0–149)
VLDL Cholesterol Cal: 16 mg/dL (ref 5–40)

## 2017-12-21 LAB — TSH: TSH: 1.2 u[IU]/mL (ref 0.450–4.500)

## 2017-12-23 DIAGNOSIS — N401 Enlarged prostate with lower urinary tract symptoms: Secondary | ICD-10-CM | POA: Diagnosis not present

## 2017-12-23 DIAGNOSIS — N138 Other obstructive and reflux uropathy: Secondary | ICD-10-CM | POA: Diagnosis not present

## 2018-01-03 ENCOUNTER — Ambulatory Visit (INDEPENDENT_AMBULATORY_CARE_PROVIDER_SITE_OTHER): Payer: Medicare Other | Admitting: Cardiology

## 2018-01-03 ENCOUNTER — Telehealth: Payer: Self-pay | Admitting: Cardiology

## 2018-01-03 VITALS — BP 148/89 | HR 92

## 2018-01-03 DIAGNOSIS — I2510 Atherosclerotic heart disease of native coronary artery without angina pectoris: Secondary | ICD-10-CM | POA: Diagnosis not present

## 2018-01-03 MED ORDER — METOPROLOL SUCCINATE ER 25 MG PO TB24: 25.0000 mg | ORAL_TABLET | Freq: Every day | ORAL | 1 refills | Status: DC

## 2018-01-03 NOTE — Patient Instructions (Signed)
Medication Instructions:  Your physician has recommended you make the following change in your medication:  START metoprolol ER 25 mg daily   Labwork: None  Testing/Procedures: None  Follow-Up: Your physician recommends that you schedule a follow-up appointment in: 8 months  Any Other Special Instructions Will Be Listed Below (If Applicable).  Please check your blood pressure daily for one week and log with pulse   Metoprolol tablets What is this medicine? METOPROLOL (me TOE proe lole) is a beta-blocker. Beta-blockers reduce the workload on the heart and help it to beat more regularly. This medicine is used to treat high blood pressure and to prevent chest pain. It is also used to after a heart attack and to prevent an additional heart attack from occurring. This medicine may be used for other purposes; ask your health care provider or pharmacist if you have questions. COMMON BRAND NAME(S): Lopressor What should I tell my health care provider before I take this medicine? They need to know if you have any of these conditions: -diabetes -heart or vessel disease like slow heart rate, worsening heart failure, heart block, sick sinus syndrome or Raynaud's disease -kidney disease -liver disease -lung or breathing disease, like asthma or emphysema -pheochromocytoma -thyroid disease -an unusual or allergic reaction to metoprolol, other beta-blockers, medicines, foods, dyes, or preservatives -pregnant or trying to get pregnant -breast-feeding How should I use this medicine? Take this medicine by mouth with a drink of water. Follow the directions on the prescription label. Take this medicine immediately after meals. Take your doses at regular intervals. Do not take more medicine than directed. Do not stop taking this medicine suddenly. This could lead to serious heart-related effects. Talk to your pediatrician regarding the use of this medicine in children. Special care may be  needed. Overdosage: If you think you have taken too much of this medicine contact a poison control center or emergency room at once. NOTE: This medicine is only for you. Do not share this medicine with others. What if I miss a dose? If you miss a dose, take it as soon as you can. If it is almost time for your next dose, take only that dose. Do not take double or extra doses. What may interact with this medicine? This medicine may interact with the following medications: -certain medicines for blood pressure, heart disease, irregular heart beat -certain medicines for depression like monoamine oxidase (MAO) inhibitors, fluoxetine, or paroxetine -clonidine -dobutamine -epinephrine -isoproterenol -reserpine This list may not describe all possible interactions. Give your health care provider a list of all the medicines, herbs, non-prescription drugs, or dietary supplements you use. Also tell them if you smoke, drink alcohol, or use illegal drugs. Some items may interact with your medicine. What should I watch for while using this medicine? Visit your doctor or health care professional for regular check ups. Contact your doctor right away if your symptoms worsen. Check your blood pressure and pulse rate regularly. Ask your health care professional what your blood pressure and pulse rate should be, and when you should contact them. You may get drowsy or dizzy. Do not drive, use machinery, or do anything that needs mental alertness until you know how this medicine affects you. Do not sit or stand up quickly, especially if you are an older patient. This reduces the risk of dizzy or fainting spells. Contact your doctor if these symptoms continue. Alcohol may interfere with the effect of this medicine. Avoid alcoholic drinks. What side effects may I notice from  receiving this medicine? Side effects that you should report to your doctor or health care professional as soon as possible: -allergic reactions  like skin rash, itching or hives -cold or numb hands or feet -depression -difficulty breathing -faint -fever with sore throat -irregular heartbeat, chest pain -rapid weight gain -swollen legs or ankles Side effects that usually do not require medical attention (report to your doctor or health care professional if they continue or are bothersome): -anxiety or nervousness -change in sex drive or performance -dry skin -headache -nightmares or trouble sleeping -short term memory loss -stomach upset or diarrhea -unusually tired This list may not describe all possible side effects. Call your doctor for medical advice about side effects. You may report side effects to FDA at 1-800-FDA-1088. Where should I keep my medicine? Keep out of the reach of children. Store at room temperature between 15 and 30 degrees C (59 and 86 degrees F). Throw away any unused medicine after the expiration date. NOTE: This sheet is a summary. It may not cover all possible information. If you have questions about this medicine, talk to your doctor, pharmacist, or health care provider.  2018 Elsevier/Gold Standard (2013-07-27 14:40:36)  How to Increase Your Level of Physical Activity Getting regular physical activity is important for your overall health and well-being. Most people do not get enough exercise. There are easy ways to increase your level of physical activity, even if you have not been very active in the past or you are just starting out. Why is physical activity important? Physical activity has many short-term and long-term health benefits. Regular exercise can:  Help you lose weight or maintain a healthy weight.  Strengthen your muscles and bones.  Boost your mood and improve self-esteem.  Reduce your risk of certain long-term (chronic) diseases, like heart disease, cancer, and diabetes.  Help you stay capable of walking and moving around (mobile) as you age.  Prevent accidents, such as falls,  as you age.  Increase life expectancy.  What are the benefits of being physically active on a regular basis? In addition to improving your physical health, being physically active on most days of the week can help you in ways that you may not expect. Benefits of regular physical activity may include:  Feeling good about your body.  Being able to move around more easily and for longer periods of time without getting tired (increased stamina).  Finding new sources of fun and enjoyment.  Meeting new people who share a common interest.  Being able to fight off illness better (enhanced immunity).  Being able to sleep better.  What can happen if I am not physically active on a regular basis? Not getting enough physical activity can lead to an unhealthy lifestyle and future health problems. This can increase your chances of:  Becoming overweight or obese.  Becoming sick.  Developing chronic illnesses, like heart disease or diabetes.  Having mental health problems, like depression or anxiety.  Having sleep problems.  Having trouble walking or getting yourself around (reduced mobility).  Injuring yourself in a fall as you get older.  What steps can I take to be more physically active?  Check with your health care provider about how to get started. Ask your health care provider what activities are safe for you.  Start out slowly. Walking or doing some simple chair exercises is a good place to start, especially if you have not been active before or for a long time.  Try to find activities that  you enjoy. You are more likely to commit to an exercise routine if it does not feel like a chore.  If you have bone or joint problems, choose low-impact exercises, like walking or swimming.  Include physical activity in your everyday routine.  Invite friends or family members to exercise with you. This also will help you commit to your workout plan.  Set goals that you can work  toward.  Aim for at least 150 minutes of moderate-intensity exercise each week. Examples of moderate-intensity exercise include walking or riding a bike. Where to find more information:  Centers for Disease Control and Prevention: BowlingGrip.is  President's Council on [employer], [employer] & Nutrition www.http://villegas.org/  ChooseMyPlate: [employer]gages.dk Contact a health care provider if:  You have headaches, muscle aches, or joint pain.  You feel dizzy or light-headed while exercising.  You faint.  You have chest pain while exercising. Summary  Exercise benefits your mind and body at any age, even if you are just starting out.  If you have a chronic illness or have not been active for a while, check with your health care provider before increasing your physical activity.  Choose activities that are safe and enjoyable for you.Ask your health care provider what activities are safe for you.  Start slowly. Tell your health care provider if you have problems as you start to increase your activity level. This information is not intended to replace advice given to you by your health care provider. Make sure you discuss any questions you have with your health care provider. Document Released: 11/11/2016 Document Revised: 11/11/2016 Document Reviewed: 11/11/2016 Elsevier Interactive Patient Education  [employer].    If you need a refill on your cardiac medications before your next appointment, please call your pharmacy.   [place], RN, BSN

## 2018-01-03 NOTE — Telephone Encounter (Signed)
Patient states that he wants you to call him and would not tell me anything except he needed to speak to you for a minute.Marland Kitchen

## 2018-01-04 ENCOUNTER — Telehealth: Payer: Self-pay | Admitting: Cardiology

## 2018-01-04 NOTE — Telephone Encounter (Signed)
Left voicemail for the patient to call the office. 

## 2018-01-04 NOTE — Telephone Encounter (Signed)
Brandon Wong wants to speak to you regarding a meeting

## 2018-01-04 NOTE — Progress Notes (Signed)
Patient presented for blood pressure and pulse check for new medications added. Per Dr. Geraldo Pitter due to pulse being at the higher end of normal the patient was started on a beta blocker. Patient was instructed to begin taking his pressure and logging this for one week and to call the office with any further concerns. Patient was also encouraged to participate in 30 minutes of physical activity 5 days a week to better control his diastolic pressure.

## 2018-01-04 NOTE — Telephone Encounter (Signed)
Left another voicemail for the patient to call the office.  

## 2018-02-03 ENCOUNTER — Telehealth: Payer: Self-pay

## 2018-02-03 NOTE — Telephone Encounter (Signed)
Patient called stating that he has been having dizziness when moving or standing to quickly. Educated patient regarding side effects of the medications and safety precautions to take. Requested patient to take his medications in the evening to possibly alleviate some of the symptoms. Also requested that the patient log his blood pressure for one week and then call the office.

## 2018-02-14 ENCOUNTER — Telehealth: Payer: Self-pay | Admitting: Cardiology

## 2018-02-14 NOTE — Telephone Encounter (Signed)
Patient states that he needs to talk toyu about his BP.  He states that he is feeling sluggish and no energy and concerned over the meds making him that way!

## 2018-02-14 NOTE — Telephone Encounter (Signed)
Patient called with blood pressure reading's: 104/72, 115/79, 105/73, and 99/72 with pulse that ranges from 69-78. States that he has continued to feel fatigued since initiating his lisinopril and metoprolol. Please advise.Brandon KitchenMarland Wong

## 2018-02-14 NOTE — Telephone Encounter (Signed)
Informed patient to discontinue this medication. Instructed to wait a few days and then call the office back with an update.

## 2018-02-21 ENCOUNTER — Other Ambulatory Visit: Payer: Self-pay

## 2018-02-21 MED ORDER — LISINOPRIL 10 MG PO TABS: 5.0000 mg | ORAL_TABLET | Freq: Every day | ORAL | 1 refills | Status: DC

## 2018-02-21 NOTE — Telephone Encounter (Signed)
Patient called stated that he is no longer feeling dizzy since discontinuing his lisinopril; however, he is concerned that his blood pressure is to high now. Patient stated his last two checks were 148/98 with heart rate of 76; he also expressed that he had a slight headache yesterday. Informed patient that lisinopril would be added back at 5 mg daily. Patient understood and will split tablets at home in half.

## 2018-02-22 DIAGNOSIS — Z6827 Body mass index (BMI) 27.0-27.9, adult: Secondary | ICD-10-CM | POA: Diagnosis not present

## 2018-02-22 DIAGNOSIS — Z79899 Other long term (current) drug therapy: Secondary | ICD-10-CM | POA: Diagnosis not present

## 2018-02-22 DIAGNOSIS — I951 Orthostatic hypotension: Secondary | ICD-10-CM | POA: Diagnosis not present

## 2018-02-27 DIAGNOSIS — I2510 Atherosclerotic heart disease of native coronary artery without angina pectoris: Secondary | ICD-10-CM | POA: Diagnosis not present

## 2018-02-27 DIAGNOSIS — I10 Essential (primary) hypertension: Secondary | ICD-10-CM | POA: Diagnosis not present

## 2018-02-27 DIAGNOSIS — Z79899 Other long term (current) drug therapy: Secondary | ICD-10-CM | POA: Diagnosis not present

## 2018-02-27 DIAGNOSIS — E7800 Pure hypercholesterolemia, unspecified: Secondary | ICD-10-CM | POA: Diagnosis not present

## 2018-03-01 ENCOUNTER — Other Ambulatory Visit: Payer: Self-pay

## 2018-03-01 MED ORDER — METOPROLOL SUCCINATE ER 25 MG PO TB24: 12.5000 mg | ORAL_TABLET | Freq: Every day | ORAL | 1 refills | Status: DC

## 2018-03-01 NOTE — Telephone Encounter (Signed)
Patient called and stated that his PCP d/c the metoprolol. Later the patient stated that his blood pressure spiked and his PCP recommended that he go to [facility]. Per the hospital the patient was to restart his beta blocker at half the dose.

## 2018-03-06 DIAGNOSIS — I10 Essential (primary) hypertension: Secondary | ICD-10-CM | POA: Diagnosis not present

## 2018-03-06 DIAGNOSIS — Z79899 Other long term (current) drug therapy: Secondary | ICD-10-CM | POA: Diagnosis not present

## 2018-03-06 DIAGNOSIS — Z6828 Body mass index (BMI) 28.0-28.9, adult: Secondary | ICD-10-CM | POA: Diagnosis not present

## 2018-05-16 DIAGNOSIS — Z13220 Encounter for screening for lipoid disorders: Secondary | ICD-10-CM | POA: Diagnosis not present

## 2018-05-16 DIAGNOSIS — I2510 Atherosclerotic heart disease of native coronary artery without angina pectoris: Secondary | ICD-10-CM | POA: Diagnosis not present

## 2018-05-16 DIAGNOSIS — Z125 Encounter for screening for malignant neoplasm of prostate: Secondary | ICD-10-CM | POA: Diagnosis not present

## 2018-05-16 DIAGNOSIS — Z131 Encounter for screening for diabetes mellitus: Secondary | ICD-10-CM | POA: Diagnosis not present

## 2018-05-16 DIAGNOSIS — Z0000 Encounter for general adult medical examination without abnormal findings: Secondary | ICD-10-CM | POA: Diagnosis not present

## 2018-05-16 DIAGNOSIS — Z79899 Other long term (current) drug therapy: Secondary | ICD-10-CM | POA: Diagnosis not present

## 2018-06-15 ENCOUNTER — Other Ambulatory Visit: Payer: Self-pay | Admitting: *Deleted

## 2018-06-15 MED ORDER — LISINOPRIL 10 MG PO TABS: 10.0000 mg | ORAL_TABLET | Freq: Every day | ORAL | 1 refills | Status: DC

## 2018-06-15 NOTE — Telephone Encounter (Signed)
Called to confirm dosage of lisinopril patient is taking. The refill request received from [facility] in [place] is written as lisinopril 10 mg daily and according to our records, patient is taking lisinopril 5 mg daily. Patient confirmed he takes 10 mg daily and is tolerating this dose well. Refill sent to pharmacy.

## 2018-06-19 ENCOUNTER — Other Ambulatory Visit: Payer: Self-pay | Admitting: *Deleted

## 2018-06-19 MED ORDER — METOPROLOL SUCCINATE ER 25 MG PO TB24: 12.5000 mg | ORAL_TABLET | Freq: Every day | ORAL | 1 refills | Status: DC

## 2018-06-20 ENCOUNTER — Other Ambulatory Visit: Payer: Self-pay | Admitting: Emergency Medicine

## 2018-06-20 MED ORDER — METOPROLOL SUCCINATE ER 25 MG PO TB24: 12.5000 mg | ORAL_TABLET | Freq: Every day | ORAL | 1 refills | Status: DC

## 2018-06-20 NOTE — Telephone Encounter (Signed)
Refill for metoprolol succinate sent to the [facility] in [place]

## 2018-06-23 DIAGNOSIS — Z0000 Encounter for general adult medical examination without abnormal findings: Secondary | ICD-10-CM | POA: Diagnosis not present

## 2018-06-23 DIAGNOSIS — Z9181 History of falling: Secondary | ICD-10-CM | POA: Diagnosis not present

## 2018-06-23 DIAGNOSIS — Z125 Encounter for screening for malignant neoplasm of prostate: Secondary | ICD-10-CM | POA: Diagnosis not present

## 2018-06-23 DIAGNOSIS — Z1331 Encounter for screening for depression: Secondary | ICD-10-CM | POA: Diagnosis not present

## 2018-06-23 DIAGNOSIS — E785 Hyperlipidemia, unspecified: Secondary | ICD-10-CM | POA: Diagnosis not present

## 2018-07-12 ENCOUNTER — Ambulatory Visit (INDEPENDENT_AMBULATORY_CARE_PROVIDER_SITE_OTHER): Payer: Medicare Other | Admitting: Cardiology

## 2018-07-12 ENCOUNTER — Encounter: Payer: Self-pay | Admitting: Cardiology

## 2018-07-12 VITALS — BP 122/70 | HR 65 | Ht 69.0 in | Wt 189.0 lb

## 2018-07-12 DIAGNOSIS — E782 Mixed hyperlipidemia: Secondary | ICD-10-CM | POA: Diagnosis not present

## 2018-07-12 DIAGNOSIS — I2510 Atherosclerotic heart disease of native coronary artery without angina pectoris: Secondary | ICD-10-CM

## 2018-07-12 DIAGNOSIS — Z86010 Personal history of colonic polyps: Secondary | ICD-10-CM | POA: Diagnosis not present

## 2018-07-12 DIAGNOSIS — Z87891 Personal history of nicotine dependence: Secondary | ICD-10-CM | POA: Diagnosis not present

## 2018-07-12 DIAGNOSIS — I10 Essential (primary) hypertension: Secondary | ICD-10-CM

## 2018-07-12 HISTORY — DX: Personal history of nicotine dependence: Z87.891

## 2018-07-12 LAB — BASIC METABOLIC PANEL
BUN/Creatinine Ratio: 20 (ref 9–20)
BUN: 16 mg/dL (ref 6–24)
CALCIUM: 9.5 mg/dL (ref 8.7–10.2)
CO2: 23 mmol/L (ref 20–29)
Chloride: 103 mmol/L (ref 96–106)
Creatinine, Ser: 0.82 mg/dL (ref 0.76–1.27)
GFR calc non Af Amer: 97 mL/min/{1.73_m2} (ref >59)
GFR, EST AFRICAN AMERICAN: 112 mL/min/{1.73_m2} (ref >59)
Glucose: 77 mg/dL (ref 65–99)
POTASSIUM: 4.7 mmol/L (ref 3.5–5.2)
SODIUM: 140 mmol/L (ref 134–144)

## 2018-07-12 LAB — CBC
HEMOGLOBIN: 14.6 g/dL (ref 13.0–17.7)
Hematocrit: 42 % (ref 37.5–51.0)
MCH: 30.5 pg (ref 26.6–33.0)
MCHC: 34.8 g/dL (ref 31.5–35.7)
MCV: 88 fL (ref 79–97)
PLATELETS: 371 10*3/uL (ref 150–450)
RBC: 4.78 x10E6/uL (ref 4.14–5.80)
RDW: 13.8 % (ref 12.3–15.4)
WBC: 7.2 10*3/uL (ref 3.4–10.8)

## 2018-07-12 LAB — HEPATIC FUNCTION PANEL
ALT: 28 IU/L (ref 0–44)
AST: 18 IU/L (ref 0–40)
Albumin: 4.5 g/dL (ref 3.5–5.5)
Alkaline Phosphatase: 72 IU/L (ref 39–117)
BILIRUBIN, DIRECT: 0.08 mg/dL (ref 0.00–0.40)
Bilirubin Total: 0.3 mg/dL (ref 0.0–1.2)
TOTAL PROTEIN: 6.6 g/dL (ref 6.0–8.5)

## 2018-07-12 LAB — TSH: TSH: 1.27 u[IU]/mL (ref 0.450–4.500)

## 2018-07-12 LAB — LIPID PANEL
CHOLESTEROL TOTAL: 143 mg/dL (ref 100–199)
Chol/HDL Ratio: 3.9 ratio (ref 0.0–5.0)
HDL: 37 mg/dL — AB (ref >39)
LDL CALC: 91 mg/dL (ref 0–99)
TRIGLYCERIDES: 76 mg/dL (ref 0–149)
VLDL Cholesterol Cal: 15 mg/dL (ref 5–40)

## 2018-07-12 NOTE — Patient Instructions (Signed)
Medication Instructions:  Your physician recommends that you continue on your current medications as directed. Please refer to the Current Medication list given to you today.   Labwork: You will have lab work today.  Testing/Procedures: NONE  Follow-Up: Your physician wants you to follow-up in: 6 months.  You will receive a reminder letter in the mail two months in advance. If you don't receive a letter, please call our office to schedule the follow-up appointment.   Any Other Special Instructions Will Be Listed Below (If Applicable).     If you need a refill on your cardiac medications before your next appointment, please call your pharmacy.

## 2018-07-12 NOTE — Progress Notes (Signed)
Cardiology Office Note:    Date:  07/12/2018   ID:  Brandon Wong, DOB [birth year], MRN [id number]  PCP:  Helen Hashimoto., MD  Cardiologist:  Jenean Lindau, MD   Referring MD: Helen Hashimoto., MD    ASSESSMENT:    1. Coronary artery disease involving native coronary artery of native heart without angina pectoris   2. Essential hypertension   3. Mixed dyslipidemia    PLAN:    In order of problems listed above:  1. Secondary prevention stressed with the patient.  Importance of compliance with diet and medications stressed and he vocalized understanding.  His blood pressure is stable.  Diet was discussed for dyslipidemia.  We will check his blood work including fasting lipids today.  Importance of regular exercise stressed, 30 minutes a day at least 5 times a week of brisk walking. 2. Patient will be seen in follow-up appointment in 6 months or earlier if the patient has any concerns    Medication Adjustments/Labs and Tests Ordered: Current medicines are reviewed at length with the patient today.  Concerns regarding medicines are outlined above.  No orders of the defined types were placed in this encounter.  No orders of the defined types were placed in this encounter.    No chief complaint on file.    History of Present Illness:    Brandon Wong is a 62 y.o. male.  Patient has known coronary artery disease.  He denies any problems at this time and takes care of activities of daily living.  No chest pain orthopnea or PND.  He does not exercise on a regular basis but it but is an active gentleman.  He is here for routine follow-up.  Past Medical History:  Diagnosis Date  . Anxiety and depression   . Chronic headache   . Degenerative disc disease, cervical   . Dyslipidemia   . External hemorrhoids   . Family history of acute myocardial infarction   . Gastritis   . GERD (gastroesophageal reflux disease)   . History of colonic polyps   . Hypertension     . Impingement syndrome of right shoulder   . Lateral epicondylitis  of elbow   . Low back pain   . Meralgia paresthetica   . MRSA (methicillin resistant Staphylococcus aureus) 2009   Skin  . Osteoarthritis   . Psoriasis    Possible psoriatic arthritis 2010  . Renal calculus   . Sleep disorder     Past Surgical History:  Procedure Laterality Date  . COLONOSCOPY    . FINGER SURGERY    . POLYPECTOMY    . ROTATOR CUFF REPAIR     right    Current Medications: Current Meds  Medication Sig  . aspirin EC 81 MG tablet Take 81 mg by mouth daily.  Marland Kitchen atorvastatin (LIPITOR) 20 MG tablet Take 1 tablet (20 mg total) by mouth daily.  Marland Kitchen lisinopril (PRINIVIL,ZESTRIL) 10 MG tablet Take 1 tablet (10 mg total) by mouth daily.  . metoprolol succinate (TOPROL-XL) 25 MG 24 hr tablet Take 0.5 tablets (12.5 mg total) by mouth daily.  . nitroGLYCERIN (NITROSTAT) 0.4 MG SL tablet Place 1 tablet (0.4 mg total) under the tongue every 5 (five) minutes as needed.  . tamsulosin (FLOMAX) 0.4 MG CAPS capsule Take 0.4 mg by mouth daily.     Allergies:   Patient has no known allergies.   Social History   Socioeconomic History  . Marital status: Single  Spouse name: Not on file  . Number of children: Not on file  . Years of education: Not on file  . Highest education level: Not on file  Occupational History  . Not on file  Social Needs  . Financial resource strain: Not on file  . Food insecurity:    Worry: Not on file    Inability: Not on file  . Transportation needs:    Medical: Not on file    Non-medical: Not on file  Tobacco Use  . Smoking status: Former Smoker    Packs/day: 0.50    Types: Cigarettes    Last attempt to quit: 06/15/2012    Years since quitting: 6.0  . Smokeless tobacco: Never Used  Substance and Sexual Activity  . Alcohol use: No  . Drug use: No  . Sexual activity: Not on file  Lifestyle  . Physical activity:    Days per week: Not on file    Minutes per session: Not  on file  . Stress: Not on file  Relationships  . Social connections:    Talks on phone: Not on file    Gets together: Not on file    Attends religious service: Not on file    Active member of club or organization: Not on file    Attends meetings of clubs or organizations: Not on file    Relationship status: Not on file  Other Topics Concern  . Not on file  Social History Narrative   Getting married.     Family History: The patient's family history includes Heart disease in his mother.  ROS:   Please see the history of present illness.    All other systems reviewed and are negative.  EKGs/Labs/Other Studies Reviewed:    The following studies were reviewed today: I discussed my findings with the patient at extensive length.   Recent Labs: 12/20/2017: ALT 26; BUN 9; Creatinine, Ser 0.95; Hemoglobin 14.8; Platelets 283; Potassium 5.2; Sodium 143; TSH 1.200  Recent Lipid Panel    Component Value Date/Time   CHOL 128 12/20/2017 0950   TRIG 79 12/20/2017 0950   HDL 46 12/20/2017 0950   CHOLHDL 2.8 12/20/2017 0950   CHOLHDL 6.5 CALC 05/30/2007 0918   VLDL 15 05/30/2007 0918   LDLCALC 66 12/20/2017 0950    Physical Exam:    VS:  BP 122/70 (BP Location: Right Arm, Patient Position: Sitting, Cuff Size: Normal)   Pulse 65   Ht 5\' 9"  (1.753 m)   Wt 189 lb (85.7 kg)   SpO2 98%   BMI 27.91 kg/m     Wt Readings from Last 3 Encounters:  07/12/18 189 lb (85.7 kg)  12/20/17 192 lb (87.1 kg)  06/15/17 183 lb (83 kg)     GEN: Patient is in no acute distress HEENT: Normal NECK: No JVD; No carotid bruits LYMPHATICS: No lymphadenopathy CARDIAC: Hear sounds regular, 2/6 systolic murmur at the apex. RESPIRATORY:  Clear to auscultation without rales, wheezing or rhonchi  ABDOMEN: Soft, non-tender, non-distended MUSCULOSKELETAL:  No edema; No deformity  SKIN: Warm and dry NEUROLOGIC:  Alert and oriented x 3 PSYCHIATRIC:  Normal affect   Signed, Jenean Lindau, MD    07/12/2018 8:40 AM    [facility]

## 2018-07-14 ENCOUNTER — Other Ambulatory Visit: Payer: Self-pay

## 2018-07-14 ENCOUNTER — Telehealth: Payer: Self-pay

## 2018-07-14 DIAGNOSIS — E782 Mixed hyperlipidemia: Secondary | ICD-10-CM

## 2018-07-14 NOTE — Addendum Note (Signed)
Addended by: Tarri Glenn on: 07/14/2018 03:56 PM   Modules accepted: Orders

## 2018-07-14 NOTE — Telephone Encounter (Signed)
Patient was notified of results and med change.

## 2018-08-03 DIAGNOSIS — M25511 Pain in right shoulder: Secondary | ICD-10-CM | POA: Diagnosis not present

## 2018-08-03 DIAGNOSIS — Z6831 Body mass index (BMI) 31.0-31.9, adult: Secondary | ICD-10-CM | POA: Diagnosis not present

## 2018-08-14 DIAGNOSIS — M25511 Pain in right shoulder: Secondary | ICD-10-CM | POA: Diagnosis not present

## 2018-08-17 ENCOUNTER — Other Ambulatory Visit: Payer: Self-pay | Admitting: *Deleted

## 2018-08-17 DIAGNOSIS — Z23 Encounter for immunization: Secondary | ICD-10-CM | POA: Diagnosis not present

## 2018-08-17 DIAGNOSIS — E782 Mixed hyperlipidemia: Secondary | ICD-10-CM

## 2018-08-17 MED ORDER — ATORVASTATIN CALCIUM 20 MG PO TABS: 20.0000 mg | ORAL_TABLET | Freq: Two times a day (BID) | ORAL | 2 refills | Status: DC

## 2018-08-17 NOTE — Telephone Encounter (Signed)
Pt phoned needing refill of Atorvastatin. Pt was informed he needs to double up on Atorvastatin so needs refills. I sent in refills and let pt know he needs to come in around 9/19 for blood work to check liver. Pt verbalized understanding and stated he would be here.

## 2018-08-24 ENCOUNTER — Other Ambulatory Visit: Payer: Self-pay

## 2018-08-24 DIAGNOSIS — E782 Mixed hyperlipidemia: Secondary | ICD-10-CM | POA: Diagnosis not present

## 2018-08-24 LAB — LIPID PANEL
CHOLESTEROL TOTAL: 92 mg/dL — AB (ref 100–199)
Chol/HDL Ratio: 2.9 ratio (ref 0.0–5.0)
HDL: 32 mg/dL — ABNORMAL LOW (ref >39)
LDL Calculated: 48 mg/dL (ref 0–99)
TRIGLYCERIDES: 62 mg/dL (ref 0–149)
VLDL CHOLESTEROL CAL: 12 mg/dL (ref 5–40)

## 2018-08-24 LAB — HEPATIC FUNCTION PANEL
ALT: 16 IU/L (ref 0–44)
AST: 13 IU/L (ref 0–40)
Albumin: 5 g/dL — ABNORMAL HIGH (ref 3.6–4.8)
Alkaline Phosphatase: 76 IU/L (ref 39–117)
BILIRUBIN, DIRECT: 0.18 mg/dL (ref 0.00–0.40)
Bilirubin Total: 0.6 mg/dL (ref 0.0–1.2)
Total Protein: 7 g/dL (ref 6.0–8.5)

## 2018-08-28 DIAGNOSIS — Z6828 Body mass index (BMI) 28.0-28.9, adult: Secondary | ICD-10-CM | POA: Diagnosis not present

## 2018-08-28 DIAGNOSIS — N401 Enlarged prostate with lower urinary tract symptoms: Secondary | ICD-10-CM | POA: Diagnosis not present

## 2018-08-28 DIAGNOSIS — Z79899 Other long term (current) drug therapy: Secondary | ICD-10-CM | POA: Diagnosis not present

## 2018-08-28 DIAGNOSIS — S301XXA Contusion of abdominal wall, initial encounter: Secondary | ICD-10-CM | POA: Diagnosis not present

## 2018-09-04 DIAGNOSIS — R05 Cough: Secondary | ICD-10-CM | POA: Diagnosis not present

## 2018-09-04 DIAGNOSIS — J302 Other seasonal allergic rhinitis: Secondary | ICD-10-CM | POA: Diagnosis not present

## 2018-09-04 DIAGNOSIS — J019 Acute sinusitis, unspecified: Secondary | ICD-10-CM | POA: Diagnosis not present

## 2018-09-04 DIAGNOSIS — Z6828 Body mass index (BMI) 28.0-28.9, adult: Secondary | ICD-10-CM | POA: Diagnosis not present

## 2018-10-02 DIAGNOSIS — R109 Unspecified abdominal pain: Secondary | ICD-10-CM | POA: Diagnosis not present

## 2018-10-02 DIAGNOSIS — Z6828 Body mass index (BMI) 28.0-28.9, adult: Secondary | ICD-10-CM | POA: Diagnosis not present

## 2018-10-02 DIAGNOSIS — Z79899 Other long term (current) drug therapy: Secondary | ICD-10-CM | POA: Diagnosis not present

## 2018-10-02 DIAGNOSIS — R0781 Pleurodynia: Secondary | ICD-10-CM | POA: Diagnosis not present

## 2018-10-18 DIAGNOSIS — M792 Neuralgia and neuritis, unspecified: Secondary | ICD-10-CM | POA: Diagnosis not present

## 2018-10-18 DIAGNOSIS — R42 Dizziness and giddiness: Secondary | ICD-10-CM | POA: Diagnosis not present

## 2018-10-18 DIAGNOSIS — Z79899 Other long term (current) drug therapy: Secondary | ICD-10-CM | POA: Diagnosis not present

## 2018-10-18 DIAGNOSIS — I10 Essential (primary) hypertension: Secondary | ICD-10-CM | POA: Diagnosis not present

## 2018-10-18 DIAGNOSIS — Z6827 Body mass index (BMI) 27.0-27.9, adult: Secondary | ICD-10-CM | POA: Diagnosis not present

## 2018-11-23 DIAGNOSIS — H9313 Tinnitus, bilateral: Secondary | ICD-10-CM | POA: Diagnosis not present

## 2018-11-23 DIAGNOSIS — Z6828 Body mass index (BMI) 28.0-28.9, adult: Secondary | ICD-10-CM | POA: Diagnosis not present

## 2018-11-23 DIAGNOSIS — Z79899 Other long term (current) drug therapy: Secondary | ICD-10-CM | POA: Diagnosis not present

## 2018-11-23 DIAGNOSIS — J45901 Unspecified asthma with (acute) exacerbation: Secondary | ICD-10-CM | POA: Diagnosis not present

## 2018-12-05 ENCOUNTER — Telehealth: Payer: Self-pay | Admitting: Cardiology

## 2018-12-05 DIAGNOSIS — Z6827 Body mass index (BMI) 27.0-27.9, adult: Secondary | ICD-10-CM | POA: Diagnosis not present

## 2018-12-05 DIAGNOSIS — F411 Generalized anxiety disorder: Secondary | ICD-10-CM | POA: Diagnosis not present

## 2018-12-05 NOTE — Telephone Encounter (Signed)
Left message for patient to return call.

## 2018-12-05 NOTE — Telephone Encounter (Signed)
s been feeling lightheaded and dizzy

## 2018-12-05 NOTE — Telephone Encounter (Signed)
Patient called back, I was unable to speak at the time due to being on another call. Patient reports to front office he is going to pcp office now.

## 2018-12-11 DIAGNOSIS — N401 Enlarged prostate with lower urinary tract symptoms: Secondary | ICD-10-CM | POA: Diagnosis not present

## 2018-12-11 DIAGNOSIS — N138 Other obstructive and reflux uropathy: Secondary | ICD-10-CM | POA: Diagnosis not present

## 2018-12-11 DIAGNOSIS — N5201 Erectile dysfunction due to arterial insufficiency: Secondary | ICD-10-CM | POA: Diagnosis not present

## 2018-12-19 ENCOUNTER — Other Ambulatory Visit: Payer: Self-pay

## 2018-12-19 DIAGNOSIS — I10 Essential (primary) hypertension: Secondary | ICD-10-CM

## 2018-12-19 MED ORDER — LISINOPRIL 10 MG PO TABS: 10.0000 mg | ORAL_TABLET | Freq: Every day | ORAL | 0 refills | Status: DC

## 2018-12-19 MED ORDER — METOPROLOL SUCCINATE ER 25 MG PO TB24: 12.5000 mg | ORAL_TABLET | Freq: Every day | ORAL | 0 refills | Status: DC

## 2018-12-21 ENCOUNTER — Other Ambulatory Visit: Payer: Self-pay

## 2018-12-21 DIAGNOSIS — I10 Essential (primary) hypertension: Secondary | ICD-10-CM

## 2018-12-21 MED ORDER — LISINOPRIL 10 MG PO TABS: 10.0000 mg | ORAL_TABLET | Freq: Every day | ORAL | 1 refills | Status: DC

## 2018-12-27 ENCOUNTER — Telehealth: Payer: Self-pay | Admitting: Cardiology

## 2018-12-27 ENCOUNTER — Other Ambulatory Visit: Payer: Self-pay

## 2018-12-27 DIAGNOSIS — I10 Essential (primary) hypertension: Secondary | ICD-10-CM

## 2018-12-27 MED ORDER — LISINOPRIL 10 MG PO TABS: 10.0000 mg | ORAL_TABLET | Freq: Every day | ORAL | 1 refills | Status: DC

## 2018-12-27 NOTE — Telephone Encounter (Signed)
Refill sent task complete. 

## 2018-12-27 NOTE — Telephone Encounter (Signed)
Want lisinopril called to [facility] [place] [facility]/not [facility]

## 2019-01-05 DIAGNOSIS — J069 Acute upper respiratory infection, unspecified: Secondary | ICD-10-CM | POA: Diagnosis not present

## 2019-01-05 DIAGNOSIS — Z6829 Body mass index (BMI) 29.0-29.9, adult: Secondary | ICD-10-CM | POA: Diagnosis not present

## 2019-01-09 DIAGNOSIS — M25511 Pain in right shoulder: Secondary | ICD-10-CM | POA: Diagnosis not present

## 2019-01-10 DIAGNOSIS — J1000 Influenza due to other identified influenza virus with unspecified type of pneumonia: Secondary | ICD-10-CM | POA: Diagnosis not present

## 2019-01-10 DIAGNOSIS — Z79899 Other long term (current) drug therapy: Secondary | ICD-10-CM | POA: Diagnosis not present

## 2019-01-10 DIAGNOSIS — J0100 Acute maxillary sinusitis, unspecified: Secondary | ICD-10-CM | POA: Diagnosis not present

## 2019-01-10 DIAGNOSIS — Z6829 Body mass index (BMI) 29.0-29.9, adult: Secondary | ICD-10-CM | POA: Diagnosis not present

## 2019-01-10 DIAGNOSIS — R6889 Other general symptoms and signs: Secondary | ICD-10-CM | POA: Diagnosis not present

## 2019-01-13 DIAGNOSIS — M25511 Pain in right shoulder: Secondary | ICD-10-CM | POA: Diagnosis not present

## 2019-01-13 DIAGNOSIS — M7551 Bursitis of right shoulder: Secondary | ICD-10-CM | POA: Diagnosis not present

## 2019-01-13 DIAGNOSIS — M75101 Unspecified rotator cuff tear or rupture of right shoulder, not specified as traumatic: Secondary | ICD-10-CM | POA: Diagnosis not present

## 2019-01-18 DIAGNOSIS — M25511 Pain in right shoulder: Secondary | ICD-10-CM | POA: Diagnosis not present

## 2019-01-18 DIAGNOSIS — M19011 Primary osteoarthritis, right shoulder: Secondary | ICD-10-CM | POA: Diagnosis not present

## 2019-01-18 DIAGNOSIS — M67911 Unspecified disorder of synovium and tendon, right shoulder: Secondary | ICD-10-CM | POA: Diagnosis not present

## 2019-01-18 DIAGNOSIS — S46111A Strain of muscle, fascia and tendon of long head of biceps, right arm, initial encounter: Secondary | ICD-10-CM | POA: Diagnosis not present

## 2019-01-24 ENCOUNTER — Encounter: Payer: Self-pay | Admitting: Cardiology

## 2019-01-24 ENCOUNTER — Ambulatory Visit (INDEPENDENT_AMBULATORY_CARE_PROVIDER_SITE_OTHER): Payer: PPO | Admitting: Cardiology

## 2019-01-24 VITALS — BP 122/78 | Ht 69.0 in | Wt 186.0 lb

## 2019-01-24 DIAGNOSIS — E782 Mixed hyperlipidemia: Secondary | ICD-10-CM

## 2019-01-24 DIAGNOSIS — Z87891 Personal history of nicotine dependence: Secondary | ICD-10-CM

## 2019-01-24 DIAGNOSIS — I10 Essential (primary) hypertension: Secondary | ICD-10-CM

## 2019-01-24 DIAGNOSIS — I2510 Atherosclerotic heart disease of native coronary artery without angina pectoris: Secondary | ICD-10-CM | POA: Diagnosis not present

## 2019-01-24 NOTE — Progress Notes (Signed)
Cardiology Office Note:    Date:  01/24/2019   ID:  [name], DOB April 02, 1956, MRN [id number]  PCP:  Helen Hashimoto., MD  Cardiologist:  Jenean Lindau, MD   Referring MD: Helen Hashimoto., MD    ASSESSMENT:    1. Coronary artery disease involving native coronary artery of native heart without angina pectoris   2. Essential hypertension   3. Ex-smoker   4. Mixed dyslipidemia    PLAN:    In order of problems listed above:  1. Secondary prevention stressed with the patient.  Importance of compliance with diet and medication stressed and he vocalized understanding.  His blood pressure is stable.  Diet was discussed for dyslipidemia he will be back in the morning for blood work. 2. Sublingual nitroglycerin prescription was sent, its protocol and 911 protocol explained and the patient vocalized understanding questions were answered to the patient's satisfaction 3. Patient will be seen in follow-up appointment in 6 months or earlier if the patient has any concerns    Medication Adjustments/Labs and Tests Ordered: Current medicines are reviewed at length with the patient today.  Concerns regarding medicines are outlined above.  No orders of the defined types were placed in this encounter.  No orders of the defined types were placed in this encounter.    No chief complaint on file.    History of Present Illness:    Brandon Wong is a 63 y.o. male.  Patient has known coronary artery disease.  He is here for routine follow-up.  He denies any chest pain orthopnea or PND.  He exercises on a regular basis.  At the time of my evaluation, the patient is alert awake oriented and in no distress.  Past Medical History:  Diagnosis Date  . Anxiety and depression   . Chronic headache   . Degenerative disc disease, cervical   . Dyslipidemia   . External hemorrhoids   . Family history of acute myocardial infarction   . Gastritis   . GERD (gastroesophageal reflux  disease)   . History of colonic polyps   . Hypertension   . Impingement syndrome of right shoulder   . Lateral epicondylitis  of elbow   . Low back pain   . Meralgia paresthetica   . MRSA (methicillin resistant Staphylococcus aureus) 2009   Skin  . Osteoarthritis   . Psoriasis    Possible psoriatic arthritis 2010  . Renal calculus   . Sleep disorder     Past Surgical History:  Procedure Laterality Date  . COLONOSCOPY    . FINGER SURGERY    . POLYPECTOMY    . ROTATOR CUFF REPAIR     right    Current Medications: Current Meds  Medication Sig  . aspirin EC 81 MG tablet Take 81 mg by mouth daily.  Marland Kitchen atorvastatin (LIPITOR) 20 MG tablet Take 1 tablet (20 mg total) by mouth 2 (two) times daily.  Marland Kitchen lisinopril (PRINIVIL,ZESTRIL) 10 MG tablet Take 1 tablet (10 mg total) by mouth daily.  . metoprolol succinate (TOPROL-XL) 25 MG 24 hr tablet Take 0.5 tablets (12.5 mg total) by mouth daily.  . nitroGLYCERIN (NITROSTAT) 0.4 MG SL tablet Place 1 tablet (0.4 mg total) under the tongue every 5 (five) minutes as needed.  . tamsulosin (FLOMAX) 0.4 MG CAPS capsule Take 0.4 mg by mouth daily.     Allergies:   Patient has no known allergies.   Social History   Socioeconomic History  . Marital status:  Single    Spouse name: Not on file  . Number of children: Not on file  . Years of education: Not on file  . Highest education level: Not on file  Occupational History  . Not on file  Social Needs  . Financial resource strain: Not on file  . Food insecurity:    Worry: Not on file    Inability: Not on file  . Transportation needs:    Medical: Not on file    Non-medical: Not on file  Tobacco Use  . Smoking status: Former Smoker    Packs/day: 0.50    Types: Cigarettes    Last attempt to quit: 06/15/2012    Years since quitting: 6.6  . Smokeless tobacco: Never Used  Substance and Sexual Activity  . Alcohol use: No  . Drug use: No  . Sexual activity: Not on file  Lifestyle  .  Physical activity:    Days per week: Not on file    Minutes per session: Not on file  . Stress: Not on file  Relationships  . Social connections:    Talks on phone: Not on file    Gets together: Not on file    Attends religious service: Not on file    Active member of club or organization: Not on file    Attends meetings of clubs or organizations: Not on file    Relationship status: Not on file  Other Topics Concern  . Not on file  Social History Narrative   Getting married.     Family History: The patient's family history includes Heart disease in his mother.  ROS:   Please see the history of present illness.    All other systems reviewed and are negative.  EKGs/Labs/Other Studies Reviewed:    The following studies were reviewed today: I discussed my findings with the patient at extensive length.   Recent Labs: 07/12/2018: BUN 16; Creatinine, Ser 0.82; Hemoglobin 14.6; Platelets 371; Potassium 4.7; Sodium 140; TSH 1.270 08/24/2018: ALT 16  Recent Lipid Panel    Component Value Date/Time   CHOL 92 (L) 08/24/2018 0826   TRIG 62 08/24/2018 0826   HDL 32 (L) 08/24/2018 0826   CHOLHDL 2.9 08/24/2018 0826   CHOLHDL 6.5 CALC 05/30/2007 0918   VLDL 15 05/30/2007 0918   LDLCALC 48 08/24/2018 0826    Physical Exam:    VS:  BP 122/78 (BP Location: Right Arm, Patient Position: Sitting, Cuff Size: Normal)   Ht 5\' 9"  (1.753 m)   Wt 186 lb (84.4 kg)   SpO2 98%   BMI 27.47 kg/m     Wt Readings from Last 3 Encounters:  01/24/19 186 lb (84.4 kg)  07/12/18 189 lb (85.7 kg)  12/20/17 192 lb (87.1 kg)     GEN: Patient is in no acute distress HEENT: Normal NECK: No JVD; No carotid bruits LYMPHATICS: No lymphadenopathy CARDIAC: Hear sounds regular, 2/6 systolic murmur at the apex. RESPIRATORY:  Clear to auscultation without rales, wheezing or rhonchi  ABDOMEN: Soft, non-tender, non-distended MUSCULOSKELETAL:  No edema; No deformity  SKIN: Warm and dry NEUROLOGIC:  Alert  and oriented x 3 PSYCHIATRIC:  Normal affect   Signed, Jenean Lindau, MD  01/24/2019 10:40 AM    [place]

## 2019-01-24 NOTE — Addendum Note (Signed)
Addended by: Orland Penman on: 01/24/2019 10:55 AM   Modules accepted: Orders

## 2019-01-24 NOTE — Patient Instructions (Signed)
Medication Instructions:   Your physician recommends that you continue on your current medications as directed. Please refer to the Current Medication list given to you today.  If you need a refill on your cardiac medications before your next appointment, please call your pharmacy.   Lab work:  Your physician recommends that you return for lab work tomorrow: BMP, TSH, LFT, LIPIDS.     If you have labs (blood work) drawn today and your tests are completely normal, you will receive your results only by: Marland Kitchen MyChart Message (if you have MyChart) OR . A paper copy in the mail If you have any lab test that is abnormal or we need to change your treatment, we will call you to review the results.  Testing/Procedures:  NONE  Follow-Up: At [facility], you and your health needs are our priority.  As part of our continuing mission to provide you with exceptional heart care, we have created designated Provider Care Teams.  These Care Teams include your primary Cardiologist (physician) and Advanced Practice Providers (APPs -  Physician Assistants and Nurse Practitioners) who all work together to provide you with the care you need, when you need it.  You will need a follow up appointment in 6 months.  Please call our office 2 months in advance to schedule this appointment.

## 2019-01-25 DIAGNOSIS — E782 Mixed hyperlipidemia: Secondary | ICD-10-CM | POA: Diagnosis not present

## 2019-01-25 DIAGNOSIS — I2510 Atherosclerotic heart disease of native coronary artery without angina pectoris: Secondary | ICD-10-CM | POA: Diagnosis not present

## 2019-01-25 DIAGNOSIS — I10 Essential (primary) hypertension: Secondary | ICD-10-CM | POA: Diagnosis not present

## 2019-01-25 DIAGNOSIS — Z87891 Personal history of nicotine dependence: Secondary | ICD-10-CM | POA: Diagnosis not present

## 2019-01-25 LAB — HEPATIC FUNCTION PANEL
ALBUMIN: 4.6 g/dL (ref 3.8–4.9)
ALK PHOS: 74 IU/L (ref 39–117)
ALT: 37 IU/L (ref 0–44)
AST: 17 IU/L (ref 0–40)
BILIRUBIN TOTAL: 0.4 mg/dL (ref 0.0–1.2)
Bilirubin, Direct: 0.1 mg/dL (ref 0.00–0.40)
Total Protein: 6.7 g/dL (ref 6.0–8.5)

## 2019-01-25 LAB — BASIC METABOLIC PANEL
BUN/Creatinine Ratio: 20 (ref 10–24)
BUN: 17 mg/dL (ref 8–27)
CO2: 21 mmol/L (ref 20–29)
Calcium: 9.7 mg/dL (ref 8.6–10.2)
Chloride: 100 mmol/L (ref 96–106)
Creatinine, Ser: 0.83 mg/dL (ref 0.76–1.27)
GFR, EST AFRICAN AMERICAN: 111 mL/min/{1.73_m2} (ref >59)
GFR, EST NON AFRICAN AMERICAN: 96 mL/min/{1.73_m2} (ref >59)
Glucose: 88 mg/dL (ref 65–99)
POTASSIUM: 4.6 mmol/L (ref 3.5–5.2)
Sodium: 139 mmol/L (ref 134–144)

## 2019-01-25 LAB — LIPID PANEL
CHOL/HDL RATIO: 2.8 ratio (ref 0.0–5.0)
Cholesterol, Total: 149 mg/dL (ref 100–199)
HDL: 54 mg/dL (ref >39)
LDL Calculated: 75 mg/dL (ref 0–99)
Triglycerides: 99 mg/dL (ref 0–149)
VLDL Cholesterol Cal: 20 mg/dL (ref 5–40)

## 2019-01-25 LAB — TSH: TSH: 1.32 u[IU]/mL (ref 0.450–4.500)

## 2019-01-26 ENCOUNTER — Telehealth: Payer: Self-pay

## 2019-01-26 NOTE — Telephone Encounter (Signed)
-----   Message from Jenean Lindau, MD sent at 01/26/2019  9:59 AM EST ----- The results of the study is unremarkable. Please inform patient. I will discuss in detail at next appointment. Cc  primary care/referring physician Jenean Lindau, MD 01/26/2019 9:59 AM

## 2019-01-26 NOTE — Telephone Encounter (Signed)
Results relayed to patient, with no questions at this time. Copy of results sent to Dr. Hale Bogus per Dr. Docia Furl request.

## 2019-03-15 ENCOUNTER — Other Ambulatory Visit: Payer: Self-pay

## 2019-03-15 DIAGNOSIS — S39012A Strain of muscle, fascia and tendon of lower back, initial encounter: Secondary | ICD-10-CM | POA: Diagnosis not present

## 2019-03-15 DIAGNOSIS — Z6824 Body mass index (BMI) 24.0-24.9, adult: Secondary | ICD-10-CM | POA: Diagnosis not present

## 2019-03-15 DIAGNOSIS — L4059 Other psoriatic arthropathy: Secondary | ICD-10-CM | POA: Diagnosis not present

## 2019-03-15 DIAGNOSIS — Z79899 Other long term (current) drug therapy: Secondary | ICD-10-CM | POA: Diagnosis not present

## 2019-03-15 DIAGNOSIS — I10 Essential (primary) hypertension: Secondary | ICD-10-CM

## 2019-03-15 DIAGNOSIS — Z6829 Body mass index (BMI) 29.0-29.9, adult: Secondary | ICD-10-CM | POA: Diagnosis not present

## 2019-03-15 DIAGNOSIS — R6889 Other general symptoms and signs: Secondary | ICD-10-CM | POA: Diagnosis not present

## 2019-03-15 MED ORDER — METOPROLOL SUCCINATE ER 25 MG PO TB24: 12.5000 mg | ORAL_TABLET | Freq: Every day | ORAL | 1 refills | Status: DC

## 2019-03-15 NOTE — Telephone Encounter (Signed)
Metoprolol  Refill sent to [facility] per pt preference

## 2019-03-19 ENCOUNTER — Other Ambulatory Visit: Payer: Self-pay | Admitting: Cardiology

## 2019-03-19 DIAGNOSIS — E782 Mixed hyperlipidemia: Secondary | ICD-10-CM

## 2019-03-19 MED ORDER — ATORVASTATIN CALCIUM 20 MG PO TABS: 20.0000 mg | ORAL_TABLET | Freq: Two times a day (BID) | ORAL | 1 refills | Status: DC

## 2019-03-19 NOTE — Telephone Encounter (Signed)
Sent Atorvastatin 20 mg to [facility]

## 2019-03-19 NOTE — Telephone Encounter (Signed)
°*  STAT* If patient is at the pharmacy, call can be transferred to refill team.   1. Which medications need to be refilled? (please list name of each medication and dose if known) atorvastatin (LIPITOR)   2. Which pharmacy/location (including street and city if local pharmacy) is medication to be sent to? [place] [facility]  3. Do they need a 30 day or 90 day supply? [place]

## 2019-03-20 DIAGNOSIS — Z87891 Personal history of nicotine dependence: Secondary | ICD-10-CM | POA: Diagnosis not present

## 2019-03-20 DIAGNOSIS — D721 Eosinophilia: Secondary | ICD-10-CM | POA: Diagnosis not present

## 2019-03-20 DIAGNOSIS — J4550 Severe persistent asthma, uncomplicated: Secondary | ICD-10-CM | POA: Diagnosis not present

## 2019-03-20 DIAGNOSIS — M25511 Pain in right shoulder: Secondary | ICD-10-CM | POA: Diagnosis not present

## 2019-03-20 DIAGNOSIS — Z1231 Encounter for screening mammogram for malignant neoplasm of breast: Secondary | ICD-10-CM | POA: Diagnosis not present

## 2019-05-29 DIAGNOSIS — Z6828 Body mass index (BMI) 28.0-28.9, adult: Secondary | ICD-10-CM | POA: Diagnosis not present

## 2019-05-29 DIAGNOSIS — S9000XA Contusion of unspecified ankle, initial encounter: Secondary | ICD-10-CM | POA: Diagnosis not present

## 2019-06-27 DIAGNOSIS — Z23 Encounter for immunization: Secondary | ICD-10-CM | POA: Diagnosis not present

## 2019-06-27 DIAGNOSIS — N451 Epididymitis: Secondary | ICD-10-CM | POA: Diagnosis not present

## 2019-06-29 DIAGNOSIS — E785 Hyperlipidemia, unspecified: Secondary | ICD-10-CM | POA: Diagnosis not present

## 2019-06-29 DIAGNOSIS — Z1331 Encounter for screening for depression: Secondary | ICD-10-CM | POA: Diagnosis not present

## 2019-06-29 DIAGNOSIS — Z125 Encounter for screening for malignant neoplasm of prostate: Secondary | ICD-10-CM | POA: Diagnosis not present

## 2019-06-29 DIAGNOSIS — Z0000 Encounter for general adult medical examination without abnormal findings: Secondary | ICD-10-CM | POA: Diagnosis not present

## 2019-06-29 DIAGNOSIS — Z139 Encounter for screening, unspecified: Secondary | ICD-10-CM | POA: Diagnosis not present

## 2019-06-29 DIAGNOSIS — Z9181 History of falling: Secondary | ICD-10-CM | POA: Diagnosis not present

## 2019-07-06 DIAGNOSIS — N509 Disorder of male genital organs, unspecified: Secondary | ICD-10-CM | POA: Diagnosis not present

## 2019-07-06 DIAGNOSIS — N401 Enlarged prostate with lower urinary tract symptoms: Secondary | ICD-10-CM | POA: Diagnosis not present

## 2019-07-06 DIAGNOSIS — N503 Cyst of epididymis: Secondary | ICD-10-CM

## 2019-07-06 DIAGNOSIS — N138 Other obstructive and reflux uropathy: Secondary | ICD-10-CM | POA: Diagnosis not present

## 2019-07-06 HISTORY — DX: Cyst of epididymis: N50.3

## 2019-07-11 DIAGNOSIS — Z6828 Body mass index (BMI) 28.0-28.9, adult: Secondary | ICD-10-CM | POA: Diagnosis not present

## 2019-07-11 DIAGNOSIS — K5909 Other constipation: Secondary | ICD-10-CM | POA: Diagnosis not present

## 2019-07-11 DIAGNOSIS — R109 Unspecified abdominal pain: Secondary | ICD-10-CM | POA: Diagnosis not present

## 2019-07-16 ENCOUNTER — Ambulatory Visit (INDEPENDENT_AMBULATORY_CARE_PROVIDER_SITE_OTHER): Payer: PPO | Admitting: Cardiology

## 2019-07-16 ENCOUNTER — Encounter: Payer: Self-pay | Admitting: Cardiology

## 2019-07-16 VITALS — BP 120/82 | HR 72 | Ht 68.0 in | Wt 179.0 lb

## 2019-07-16 DIAGNOSIS — I2510 Atherosclerotic heart disease of native coronary artery without angina pectoris: Secondary | ICD-10-CM | POA: Diagnosis not present

## 2019-07-16 DIAGNOSIS — E782 Mixed hyperlipidemia: Secondary | ICD-10-CM | POA: Diagnosis not present

## 2019-07-16 DIAGNOSIS — I10 Essential (primary) hypertension: Secondary | ICD-10-CM

## 2019-07-16 DIAGNOSIS — Z1329 Encounter for screening for other suspected endocrine disorder: Secondary | ICD-10-CM | POA: Diagnosis not present

## 2019-07-16 NOTE — Patient Instructions (Signed)
Medication Instructions:  Your physician recommends that you continue on your current medications as directed. Please refer to the Current Medication list given to you today.  If you need a refill on your cardiac medications before your next appointment, please call your pharmacy.   Lab work: Your physician recommends that you have BMP, CBC, TSH, lipid and hepatic drawn today.  If you have labs (blood work) drawn today and your tests are completely normal, you will receive your results only by: Marland Kitchen MyChart Message (if you have MyChart) OR . A paper copy in the mail If you have any lab test that is abnormal or we need to change your treatment, we will call you to review the results.  Testing/Procedures: You had an EKG performed today  Follow-Up: At [facility], you and your health needs are our priority.  As part of our continuing mission to provide you with exceptional heart care, we have created designated Provider Care Teams.  These Care Teams include your primary Cardiologist (physician) and Advanced Practice Providers (APPs -  Physician Assistants and Nurse Practitioners) who all work together to provide you with the care you need, when you need it. You will need a follow up appointment in 8 months.

## 2019-07-16 NOTE — Progress Notes (Signed)
Cardiology Office Note:    Date:  07/16/2019   ID:  CODA Brandon Wong, DOB 1956-06-14, MRN [id number]  PCP:  Helen Hashimoto., MD  Cardiologist:  Jenean Lindau, MD   Referring MD: Helen Hashimoto., MD    ASSESSMENT:    1. Coronary artery disease involving native coronary artery of native heart without angina pectoris   2. Essential hypertension   3. Mixed dyslipidemia    PLAN:    In order of problems listed above:  1. Secondary prevention stressed with the patient.  Importance of compliance with diet and medication stressed and he vocalized understanding. 2. Essential hypertension: Blood pressure stable 3. Coronary artery disease: Secondary prevention stressed.  He will have all blood work today including fasting lipids 4. Mixed dyslipidemia: Diet was discussed and we will review his blood work and get back to him 5. Patient will be seen in follow-up appointment in 6 months or earlier if the patient has any concerns 6. Because of the fact that he is on Cialis on a daily basis I told him that use of sublingual nitroglycerin would be contraindicated and he understands the benefit risk ratio with this situation.  I discussed this with him at length.  His urologist has him on Cialis and the patient wants to continue it for personal reasons and I respect his wishes.   Medication Adjustments/Labs and Tests Ordered: Current medicines are reviewed at length with the patient today.  Concerns regarding medicines are outlined above.  No orders of the defined types were placed in this encounter.  No orders of the defined types were placed in this encounter.    No chief complaint on file.    History of Present Illness:    Brandon Wong is a 63 y.o. male.  Patient has history of coronary artery disease.  He denies any problems at this time and takes care of activities of daily living without any problems.  He exercises on a regular basis.  At the time of my evaluation, the  patient is alert awake oriented and in no distress.  No chest pain orthopnea or PND.  He has been prescribed Cialis by his doctor on a daily basis and told not to take nitroglycerin.  Past Medical History:  Diagnosis Date  . Anxiety and depression   . Chronic headache   . Degenerative disc disease, cervical   . Dyslipidemia   . External hemorrhoids   . Family history of acute myocardial infarction   . Gastritis   . GERD (gastroesophageal reflux disease)   . History of colonic polyps   . Hypertension   . Impingement syndrome of right shoulder   . Lateral epicondylitis  of elbow   . Low back pain   . Meralgia paresthetica   . MRSA (methicillin resistant Staphylococcus aureus) 2009   Skin  . Osteoarthritis   . Psoriasis    Possible psoriatic arthritis 2010  . Renal calculus   . Sleep disorder     Past Surgical History:  Procedure Laterality Date  . COLONOSCOPY    . FINGER SURGERY    . POLYPECTOMY    . ROTATOR CUFF REPAIR     right    Current Medications: Current Meds  Medication Sig  . aspirin EC 81 MG tablet Take 81 mg by mouth daily.  Marland Kitchen atorvastatin (LIPITOR) 20 MG tablet Take 1 tablet (20 mg total) by mouth 2 (two) times daily.  . metoprolol succinate (TOPROL-XL) 25 MG 24 hr tablet  Take 0.5 tablets (12.5 mg total) by mouth daily.  . tadalafil (CIALIS) 5 MG tablet Take 5 mg by mouth daily as needed for erectile dysfunction.  . tamsulosin (FLOMAX) 0.4 MG CAPS capsule Take 0.4 mg by mouth daily.     Allergies:   Patient has no known allergies.   Social History   Socioeconomic History  . Marital status: Single    Spouse name: Not on file  . Number of children: Not on file  . Years of education: Not on file  . Highest education level: Not on file  Occupational History  . Not on file  Social Needs  . Financial resource strain: Not on file  . Food insecurity    Worry: Not on file    Inability: Not on file  . Transportation needs    Medical: Not on file     Non-medical: Not on file  Tobacco Use  . Smoking status: Former Smoker    Packs/day: 0.50    Types: Cigarettes    Quit date: 06/15/2012    Years since quitting: 7.0  . Smokeless tobacco: Never Used  Substance and Sexual Activity  . Alcohol use: No  . Drug use: No  . Sexual activity: Not on file  Lifestyle  . Physical activity    Days per week: Not on file    Minutes per session: Not on file  . Stress: Not on file  Relationships  . Social Herbalist on phone: Not on file    Gets together: Not on file    Attends religious service: Not on file    Active member of club or organization: Not on file    Attends meetings of clubs or organizations: Not on file    Relationship status: Not on file  Other Topics Concern  . Not on file  Social History Narrative   Getting married.     Family History: The patient's family history includes Heart disease in his mother.  ROS:   Please see the history of present illness.    All other systems reviewed and are negative.  EKGs/Labs/Other Studies Reviewed:    The following studies were reviewed today: EKG reveals sinus rhythm and nonspecific ST-T changes   Recent Labs: 01/25/2019: ALT 37; BUN 17; Creatinine, Ser 0.83; Potassium 4.6; Sodium 139; TSH 1.320  Recent Lipid Panel    Component Value Date/Time   CHOL 149 01/25/2019 0912   TRIG 99 01/25/2019 0912   HDL 54 01/25/2019 0912   CHOLHDL 2.8 01/25/2019 0912   CHOLHDL 6.5 CALC 05/30/2007 0918   VLDL 15 05/30/2007 0918   LDLCALC 75 01/25/2019 0912    Physical Exam:    VS:  BP 120/82 (BP Location: Right Arm, Patient Position: Sitting)   Pulse 72   Ht 5\' 8"  (1.727 m)   Wt 179 lb (81.2 kg)   BMI 27.22 kg/m     Wt Readings from Last 3 Encounters:  07/16/19 179 lb (81.2 kg)  01/24/19 186 lb (84.4 kg)  07/12/18 189 lb (85.7 kg)     GEN: Patient is in no acute distress HEENT: Normal NECK: No JVD; No carotid bruits LYMPHATICS: No lymphadenopathy CARDIAC: Hear  sounds regular, 2/6 systolic murmur at the apex. RESPIRATORY:  Clear to auscultation without rales, wheezing or rhonchi  ABDOMEN: Soft, non-tender, non-distended MUSCULOSKELETAL:  No edema; No deformity  SKIN: Warm and dry NEUROLOGIC:  Alert and oriented x 3 PSYCHIATRIC:  Normal affect   Signed, Reita Cliche Kalia Vahey,  MD  07/16/2019 11:39 AM    [facility]

## 2019-07-17 LAB — HEPATIC FUNCTION PANEL
ALT: 31 IU/L (ref 0–44)
AST: 20 IU/L (ref 0–40)
Albumin: 4.3 g/dL (ref 3.8–4.9)
Alkaline Phosphatase: 86 IU/L (ref 39–117)
Bilirubin Total: 0.3 mg/dL (ref 0.0–1.2)
Bilirubin, Direct: 0.1 mg/dL (ref 0.00–0.40)
Total Protein: 6.3 g/dL (ref 6.0–8.5)

## 2019-07-17 LAB — LIPID PANEL
Chol/HDL Ratio: 3 ratio (ref 0.0–5.0)
Cholesterol, Total: 128 mg/dL (ref 100–199)
HDL: 43 mg/dL (ref >39)
LDL Calculated: 70 mg/dL (ref 0–99)
Triglycerides: 75 mg/dL (ref 0–149)
VLDL Cholesterol Cal: 15 mg/dL (ref 5–40)

## 2019-07-17 LAB — CBC
Hematocrit: 41.7 % (ref 37.5–51.0)
Hemoglobin: 14.2 g/dL (ref 13.0–17.7)
MCH: 30 pg (ref 26.6–33.0)
MCHC: 34.1 g/dL (ref 31.5–35.7)
MCV: 88 fL (ref 79–97)
Platelets: 318 10*3/uL (ref 150–450)
RBC: 4.73 x10E6/uL (ref 4.14–5.80)
RDW: 12.5 % (ref 11.6–15.4)
WBC: 7.1 10*3/uL (ref 3.4–10.8)

## 2019-07-17 LAB — BASIC METABOLIC PANEL
BUN/Creatinine Ratio: 16 (ref 10–24)
BUN: 13 mg/dL (ref 8–27)
CO2: 24 mmol/L (ref 20–29)
Calcium: 9.6 mg/dL (ref 8.6–10.2)
Chloride: 103 mmol/L (ref 96–106)
Creatinine, Ser: 0.83 mg/dL (ref 0.76–1.27)
GFR calc Af Amer: 111 mL/min/{1.73_m2} (ref >59)
GFR calc non Af Amer: 96 mL/min/{1.73_m2} (ref >59)
Glucose: 105 mg/dL — ABNORMAL HIGH (ref 65–99)
Potassium: 4.6 mmol/L (ref 3.5–5.2)
Sodium: 140 mmol/L (ref 134–144)

## 2019-07-17 LAB — TSH: TSH: 1.01 u[IU]/mL (ref 0.450–4.500)

## 2019-07-18 ENCOUNTER — Encounter: Payer: Self-pay | Admitting: *Deleted

## 2019-07-24 DIAGNOSIS — L309 Dermatitis, unspecified: Secondary | ICD-10-CM | POA: Diagnosis not present

## 2019-07-25 ENCOUNTER — Ambulatory Visit: Payer: PPO | Admitting: Cardiology

## 2019-07-30 DIAGNOSIS — Z6829 Body mass index (BMI) 29.0-29.9, adult: Secondary | ICD-10-CM | POA: Diagnosis not present

## 2019-08-28 ENCOUNTER — Telehealth: Payer: Self-pay | Admitting: Neurology

## 2019-08-28 ENCOUNTER — Other Ambulatory Visit: Payer: Self-pay

## 2019-08-28 ENCOUNTER — Encounter: Payer: Self-pay | Admitting: Neurology

## 2019-08-28 ENCOUNTER — Ambulatory Visit (INDEPENDENT_AMBULATORY_CARE_PROVIDER_SITE_OTHER): Payer: PPO | Admitting: Neurology

## 2019-08-28 VITALS — BP 129/91 | HR 78 | Temp 97.5°F | Ht 69.0 in | Wt 181.0 lb

## 2019-08-28 DIAGNOSIS — M5417 Radiculopathy, lumbosacral region: Secondary | ICD-10-CM

## 2019-08-28 DIAGNOSIS — M5442 Lumbago with sciatica, left side: Secondary | ICD-10-CM

## 2019-08-28 DIAGNOSIS — G8929 Other chronic pain: Secondary | ICD-10-CM

## 2019-08-28 DIAGNOSIS — R29898 Other symptoms and signs involving the musculoskeletal system: Secondary | ICD-10-CM | POA: Diagnosis not present

## 2019-08-28 DIAGNOSIS — M5441 Lumbago with sciatica, right side: Secondary | ICD-10-CM

## 2019-08-28 HISTORY — DX: Radiculopathy, lumbosacral region: M54.17

## 2019-08-28 NOTE — Patient Instructions (Signed)
Lumbosacral Radiculopathy Lumbosacral radiculopathy is a condition that involves the spinal nerves and nerve roots in the low back and bottom of the spine. The condition develops when these nerves and nerve roots move out of place or become inflamed and cause symptoms. What are the causes? This condition may be caused by:  Pressure from a disk that bulges out of place (herniated disk). A disk is a plate of soft cartilage that separates bones in the spine.  Disk changes that occur with age (disk degeneration).  A narrowing of the bones of the lower back (spinal stenosis).  A tumor.  An infection.  An injury that places sudden pressure on the disks that cushion the bones of your lower spine. What increases the risk? You are more likely to develop this condition if:  You are a male who is 30-50 years old.  You are a male who is 50-60 years old.  You use improper technique when lifting things.  You are overweight or live a sedentary lifestyle.  Your work requires frequent lifting.  You smoke.  You do repetitive activities that strain the spine. What are the signs or symptoms? Symptoms of this condition include:  Pain that goes down from your back into your legs (sciatica), usually on one side of the body. This is the most common symptom. The pain may be worse with sitting, coughing, or sneezing.  Pain and numbness in your legs.  Muscle weakness.  Tingling.  Loss of bladder control or bowel control. How is this diagnosed? This condition may be diagnosed based on:  Your symptoms and medical history.  A physical exam. If the pain is lasting, you may have tests, such as:  MRI scan.  X-ray.  CT scan.  A type of X-ray used to examine the spinal canal after injecting a dye into your spine (myelogram).  A test to measure how electrical impulses move through a nerve (nerve conduction study). How is this treated? Treatment may depend on the cause of the condition and  may include:  Working with a physical therapist.  Taking pain medicine.  Applying heat and ice to affected areas.  Doing stretches to improve flexibility.  Doing exercises to strengthen back muscles.  Having chiropractic spinal manipulation.  Using transcutaneous electrical nerve stimulation (TENS) therapy.  Getting a steroid injection in the spine. In some cases, no treatment is needed. If the condition is long-lasting (chronic), or if symptoms are severe, treatment may involve surgery or lifestyle changes, such as following a weight-loss plan. Follow these instructions at home: Activity  Avoid bending and other activities that make the problem worse.  Maintain a proper position when standing or sitting: ? When standing, keep your upper back and neck straight, with your shoulders pulled back. Avoid slouching. ? When sitting, keep your back straight and relax your shoulders. Do not round your shoulders or pull them backward.  Do not sit or stand in one place for long periods of time.  Take brief periods of rest throughout the day. This will reduce your pain. It is usually better to rest by lying down or standing, not sitting.  When you are resting for longer periods, mix in some mild activity or stretching between periods of rest. This will help to prevent stiffness and pain.  Get regular exercise. Ask your health care provider what activities are safe for you. If you were shown how to do any exercises or stretches, do them as directed by your health care provider.  Do   not lift anything that is heavier than 10 lb (4.5 kg) or the limit that you are told by your health care provider. Always use proper lifting technique, which includes: ? Bending your knees. ? Keeping the load close to your body. ? Avoiding twisting. Managing pain  If directed, put ice on the affected area: ? Put ice in a plastic bag. ? Place a towel between your skin and the bag. ? Leave the ice on for 20  minutes, 2-3 times a day.  If directed, apply heat to the affected area as often as told by your health care provider. Use the heat source that your health care provider recommends, such as a moist heat pack or a heating pad. ? Place a towel between your skin and the heat source. ? Leave the heat on for 20-30 minutes. ? Remove the heat if your skin turns bright red. This is especially important if you are unable to feel pain, heat, or cold. You may have a greater risk of getting burned.  Take over-the-counter and prescription medicines only as told by your health care provider. General instructions  Sleep on a firm mattress in a comfortable position. Try lying on your side with your knees slightly bent. If you lie on your back, put a pillow under your knees.  Do not drive or use heavy machinery while taking prescription pain medicine.  If your health care provider prescribed a diet or exercise program, follow it as directed.  Keep all follow-up visits as told by your health care provider. This is important. Contact a health care provider if:  Your pain does not improve over time, even when taking pain medicines. Get help right away if:  You develop severe pain.  Your pain suddenly gets worse.  You develop increasing weakness in your legs.  You lose the ability to control your bladder or bowel.  You have difficulty walking or balancing.  You have a fever. Summary  Lumbosacral radiculopathy is a condition that occurs when the spinal nerves and nerve roots in the lower part of the spine move out of place or become inflamed and cause symptoms.  Symptoms include pain, numbness, and tingling that go down from your back into your legs (sciatica), muscle weakness, and loss of bladder control or bowel control.  If directed, apply ice or heat to the affected area as told by your health care provider.  Follow instructions about activity, rest, and proper lifting technique. This  information is not intended to replace advice given to you by your health care provider. Make sure you discuss any questions you have with your health care provider. Document Released: 11/22/2005 Document Revised: 11/10/2017 Document Reviewed: 11/10/2017 Elsevier Patient Education  2020 [employer]  

## 2019-08-28 NOTE — Progress Notes (Signed)
[id number]    Provider:  Dr Jaynee Eagles Requesting Provider: Helen Hashimoto., MD Primary Care Provider:  Helen Hashimoto., MD  CC: Low back pain, started years ago, worsens especially if he sits too long.  HPI:  Brandon Wong is a 63 y.o. male here as requested by Helen Hashimoto., MD for neuralgia and neuritis.  Past medical history sleep disorder, osteoarthritis, meralgia paresthetica, low back pain, impingement syndrome of right shoulder, hypertension, GERD, dyslipidemia, degenerative disc disease, cervical, chronic headache, anxiety and depression, vitamin B12 deficiency, coronary artery disease, transient loss of consciousness, high risk medication use. He has chronic low back pain, a radiated disk for years. If he sits too long it gets worse, gets stiff a lot, he has arthritic changes in his low back and neck. [place] and that's when it became more severe, around June, both legs, back pain, radiates into the buttocks and down the side of the legs bilateral. Severe. Better if he doesn't pick up heavy things or doesn't do things he is not supposed to. He has taken tylenol, heating, stretching, conservative measures for years, under the care of doctors for years and now worsening. He also endorses weakness in his legs, difficulty going up and down steps.  Moving around helps to an extent. Worse sitting still for long periods. No current necj issues, imbalance, falls, changes in bowel or bladder. No other focal neurologic deficits, associated symptoms, inciting events or modifiable factors.   Reviewed notes, labs and imaging from outside physicians, which showed:  I reviewed notes from [facility] specialty group practice Dr. Megan Salon, he has history of scoliosis now with radiculopathy to the back of the legs, referred here for evaluation, neurologic exam appeared to be nonfocal on Dr. Hale Bogus evaluation.  Cbc,tsh,bmp unremarkable   Dg lumbar spine 2008: personally reviewed images and agree with the following:  1. Minimal compression fracture of the superior endplate of 624THL of indeterminate age but likely chronic.   2. Mild disk degeneration at L4-5.       Review of Systems: Patient complains of symptoms per HPI as well as the following symptoms: Headache, snoring, joint pain, joint swelling, cramps, aching muscles, constipation, ringing in the ear. Pertinent negatives and positives per HPI. All others negative.   Social History   Socioeconomic History  . Marital status: Married    Spouse name: Not on file  . Number of children: 4  . Years of education: Not on file  . Highest education level: Not on file  Occupational History  . Not on file  Social Needs  . Financial resource strain: Not on file  . Food insecurity    Worry: Not on file    Inability: Not on file  . Transportation needs    Medical: Not on file    Non-medical: Not on file  Tobacco Use  . Smoking status: Former Smoker    Packs/day: 0.50    Types: Cigarettes    Quit date: 06/15/2012    Years since quitting: 7.2  . Smokeless tobacco: Never Used  Substance and Sexual Activity  . Alcohol use: Never    Frequency: Never  . Drug use: Never  . Sexual activity: Not on file  Lifestyle  . Physical activity    Days per week: Not on file    Minutes per session: Not on file  . Stress: Not on file  Relationships  . Social Herbalist on  phone: Not on file    Gets together: Not on file    Attends religious service: Not on file    Active member of club or organization: Not on file    Attends meetings of clubs or organizations: Not on file    Relationship status: Not on file  . Intimate partner violence    Fear of current or ex partner: Not on file    Emotionally abused: Not on file    Physically abused: Not on file    Forced sexual activity: Not on file  Other Topics Concern  . Not on file  Social History Narrative   Lives at  home with wife & 2 sons   Right handed    Family History  Problem Relation Age of Onset  . Heart disease Mother   . Heart attack Mother     Past Medical History:  Diagnosis Date  . Anxiety and depression   . Chronic headache    pt states no longer an issue  . Degenerative disc disease, cervical   . Dyslipidemia   . External hemorrhoids   . Family history of acute myocardial infarction   . Gastritis    in the past per pt   . GERD (gastroesophageal reflux disease)    in the past per pt   . History of colonic polyps   . Hypertension   . Impingement syndrome of right shoulder   . Lateral epicondylitis  of elbow   . Low back pain   . Meralgia paresthetica   . MRSA (methicillin resistant Staphylococcus aureus) 2009   Skin  . Osteoarthritis   . Psoriasis    Possible psoriatic arthritis 2010  . Renal calculus   . Sleep disorder    pt states he doesn't have this anymore    Patient Active Problem List   Diagnosis Date Noted  . Lumbosacral radiculopathy at L5 08/28/2019  . Ex-smoker 07/12/2018  . Plantar fasciitis 08/04/2017  . Coronary artery disease involving native coronary artery of native heart without angina pectoris 06/15/2017  . Tubular adenoma of colon 02/25/2016  . KNEE PAIN, LEFT 09/12/2009  . ARTHRITIS 04/14/2009  . SKIN RASH 04/14/2009  . METHICILLIN RESISTANT STAPHYLOCOCCUS AUREUS INFECTION 06/13/2008  . BREAST MASS, RIGHT 06/13/2008  . CELLULITIS AND ABSCESS OF UNSPECIFIED SITE 06/13/2008  . Mixed dyslipidemia 05/10/2008  . ANXIETY DEPRESSION 05/10/2008  . Essential hypertension 05/10/2008  . HEMORRHOIDS, EXTERNAL 05/10/2008  . GERD 05/10/2008  . GASTRITIS 05/10/2008  . DEGENERATIVE DISC DISEASE, CERVICAL SPINE 05/10/2008  . SHOULDER IMPINGEMENT SYNDROME, RIGHT 05/10/2008  . LATERAL EPICONDYLITIS OF ELBOW 05/10/2008  . RENAL CALCULUS, HX OF 05/10/2008  . SLEEP DISORDER, HX OF 05/10/2008  . HEADACHE, CHRONIC, HX OF 05/10/2008  . MERALGIA PARESTHETICA  11/21/2007  . OSTEOARTHRITIS 11/21/2007  . Chronic bilateral low back pain with right-sided sciatica 11/21/2007  . COLONIC POLYPS, HX OF 11/21/2007    Past Surgical History:  Procedure Laterality Date  . COLONOSCOPY    . CORONARY ANGIOPLASTY WITH STENT PLACEMENT  2013   3 stents  . FINGER SURGERY    . POLYPECTOMY    . ROTATOR CUFF REPAIR     right    Current Outpatient Medications  Medication Sig Dispense Refill  . aspirin EC 81 MG tablet Take 81 mg by mouth daily.    Marland Kitchen atorvastatin (LIPITOR) 20 MG tablet Take 1 tablet (20 mg total) by mouth 2 (two) times daily. 180 tablet 1  . diazepam (VALIUM) 2 MG tablet  Take 2 mg by mouth as needed for anxiety.    Marland Kitchen lisinopril (PRINIVIL,ZESTRIL) 10 MG tablet Take 1 tablet (10 mg total) by mouth daily. 90 tablet 1  . metoprolol succinate (TOPROL-XL) 25 MG 24 hr tablet Take 0.5 tablets (12.5 mg total) by mouth daily. 90 tablet 1  . tadalafil (CIALIS) 5 MG tablet Take 5 mg by mouth daily as needed for erectile dysfunction.    . nitroGLYCERIN (NITROSTAT) 0.4 MG SL tablet Place 1 tablet (0.4 mg total) under the tongue every 5 (five) minutes as needed. (Patient not taking: Reported on 07/16/2019) 25 tablet 6  . tamsulosin (FLOMAX) 0.4 MG CAPS capsule Take 0.4 mg by mouth daily.     No current facility-administered medications for this visit.     Allergies as of 08/28/2019  . (No Known Allergies)    Vitals: BP (!) 129/91 (BP Location: Right Arm, Patient Position: Sitting)   Pulse 78   Temp (!) 97.5 F (36.4 C) Comment: taken by check in staff  Ht 5\' 9"  (1.753 m)   Wt 181 lb (82.1 kg)   BMI 26.73 kg/m  Last Weight:  Wt Readings from Last 1 Encounters:  08/28/19 181 lb (82.1 kg)   Last Height:   Ht Readings from Last 1 Encounters:  08/28/19 5\' 9"  (1.753 m)     Physical exam: Exam: Gen: NAD, conversant, well nourised, well groomed                     CV: RRR, no MRG. No Carotid Bruits. No peripheral edema, warm, nontender Eyes:  Conjunctivae clear without exudates or hemorrhage  Neuro: Detailed Neurologic Exam  Speech:    Speech is normal; fluent and spontaneous with normal comprehension.  Cognition:    The patient is oriented to person, place, and time;     recent and remote memory intact;     language fluent;     normal attention, concentration,     fund of knowledge Cranial Nerves:    The pupils are equal, round, and reactive to light. Attempted fundoscopy pupils too small.  Visual fields are full to finger confrontation. Extraocular movements are intact. Trigeminal sensation is intact and the muscles of mastication are normal. The face is symmetric. The palate elevates in the midline. Hearing intact. Voice is normal. Shoulder shrug is normal. The tongue has normal motion without fasciculations.   Coordination:    No  Gait:    Normal native gait  Motor Observation:    No asymmetry, no atrophy, and no involuntary movements noted. Tone:    Normal muscle tone.    Posture:    Posture is normal. normal erect    Strength:    Strength is V/V in the upper and lower limbs.      Sensation: intact to LT     Reflex Exam:  DTR's:    Deep tendon reflexes in the upper and lower extremities are brisk bilaterally.   Toes:    The toes are downgoing bilaterally.   Clonus:    2 beats Ajs    Assessment/Plan: Very nice 63 year old with many years of low back pain, he has been under the care of doctors for many years, tried conservative measures for many years including Tylenol, heating, therapy, analgesics and other over-the-counter medications, stretching, muscle relaxers.  He is also been under the care of physicians and had x-rays of his low back which did show degenerative disease and possible radiculopathy however we need an MRI  of the lumbar spine due to worsening, weakness in the legs, radiculopathy likely L5 radiculopathy and may need surgical intervention. He has brisk reflexes, and possibly clonus right  knee and 2 beats Ajs. He denies any neck pain, ataxia, falls, sensory changes.   Orders Placed This Encounter  Procedures  . MR LUMBAR SPINE WO CONTRAST     Cc: Helen Hashimoto., MD,    Sarina Ill, MD  [facility] [address] [place], [place] [address]  Phone [phone] Fax [phone]

## 2019-08-28 NOTE — Telephone Encounter (Signed)
health team order sent to GI. No auth they will reach out to the patient to schedule.  

## 2019-08-30 DIAGNOSIS — H9190 Unspecified hearing loss, unspecified ear: Secondary | ICD-10-CM | POA: Diagnosis not present

## 2019-08-30 DIAGNOSIS — H903 Sensorineural hearing loss, bilateral: Secondary | ICD-10-CM | POA: Diagnosis not present

## 2019-08-30 DIAGNOSIS — Z77122 Contact with and (suspected) exposure to noise: Secondary | ICD-10-CM | POA: Diagnosis not present

## 2019-08-30 DIAGNOSIS — H9313 Tinnitus, bilateral: Secondary | ICD-10-CM | POA: Diagnosis not present

## 2019-09-07 ENCOUNTER — Other Ambulatory Visit: Payer: PPO

## 2019-09-14 DIAGNOSIS — H903 Sensorineural hearing loss, bilateral: Secondary | ICD-10-CM | POA: Diagnosis not present

## 2019-09-16 ENCOUNTER — Ambulatory Visit
Admission: RE | Admit: 2019-09-16 | Discharge: 2019-09-16 | Disposition: A | Discharge status: Home / Self Care | Payer: PPO | Source: Ambulatory Visit | Attending: Neurology | Admitting: Neurology

## 2019-09-16 ENCOUNTER — Other Ambulatory Visit: Payer: Self-pay

## 2019-09-16 DIAGNOSIS — G8929 Other chronic pain: Secondary | ICD-10-CM

## 2019-09-16 DIAGNOSIS — R29898 Other symptoms and signs involving the musculoskeletal system: Secondary | ICD-10-CM

## 2019-09-16 DIAGNOSIS — M5416 Radiculopathy, lumbar region: Secondary | ICD-10-CM | POA: Diagnosis not present

## 2019-09-16 DIAGNOSIS — M5417 Radiculopathy, lumbosacral region: Secondary | ICD-10-CM

## 2019-09-18 ENCOUNTER — Other Ambulatory Visit: Payer: Self-pay | Admitting: Cardiology

## 2019-09-18 DIAGNOSIS — E782 Mixed hyperlipidemia: Secondary | ICD-10-CM

## 2019-09-18 DIAGNOSIS — I10 Essential (primary) hypertension: Secondary | ICD-10-CM

## 2019-09-20 ENCOUNTER — Encounter: Payer: Self-pay | Admitting: *Deleted

## 2019-09-20 ENCOUNTER — Telehealth: Payer: Self-pay | Admitting: *Deleted

## 2019-09-20 DIAGNOSIS — M48061 Spinal stenosis, lumbar region without neurogenic claudication: Secondary | ICD-10-CM

## 2019-09-20 NOTE — Telephone Encounter (Signed)
Note opened in error. Please see phone call with pt in other phone note labeled Results from 09/20/2019.

## 2019-09-20 NOTE — Telephone Encounter (Signed)
-----   Message from Melvenia Beam, MD sent at 09/17/2019  7:09 PM EDT ----- Patient has multi-level arthritic changes in his back. However no definitive evidence for any nerve pinching. At this point I would send him to someone who could possibly try some epidural steroid injections. Please ask patient if he is amenable to this and we can place referral to Dr. Maryjean Ka thanks

## 2019-09-20 NOTE — Telephone Encounter (Signed)
I called the pt & LVM asking for call back.

## 2019-09-20 NOTE — Addendum Note (Signed)
Addended by: Gildardo Griffes on: 09/20/2019 02:26 PM   Modules accepted: Orders

## 2019-09-20 NOTE — Telephone Encounter (Signed)
Patient returned my call. I discussed the whole MRI result note in detail. He verbalized understanding and appreciation. He is amenable to the referral to Dr. Maryjean Ka but would like to schedule for middle of November. I advised we could go ahead and send the referral and then he could schedule with Dr. Cyril Mourning office for a time that works best for him. He verbalized appreciation for the call.

## 2019-09-20 NOTE — Addendum Note (Signed)
Addended by: Gildardo Griffes on: 09/20/2019 02:38 PM   Modules accepted: Orders

## 2019-09-26 DIAGNOSIS — M25512 Pain in left shoulder: Secondary | ICD-10-CM | POA: Diagnosis not present

## 2019-10-08 DIAGNOSIS — E663 Overweight: Secondary | ICD-10-CM | POA: Diagnosis not present

## 2019-10-08 DIAGNOSIS — Z6829 Body mass index (BMI) 29.0-29.9, adult: Secondary | ICD-10-CM | POA: Diagnosis not present

## 2019-10-08 DIAGNOSIS — J302 Other seasonal allergic rhinitis: Secondary | ICD-10-CM | POA: Diagnosis not present

## 2019-10-09 DIAGNOSIS — M51369 Other intervertebral disc degeneration, lumbar region without mention of lumbar back pain or lower extremity pain: Secondary | ICD-10-CM | POA: Insufficient documentation

## 2019-10-09 DIAGNOSIS — M5416 Radiculopathy, lumbar region: Secondary | ICD-10-CM | POA: Insufficient documentation

## 2019-10-09 DIAGNOSIS — M5136 Other intervertebral disc degeneration, lumbar region: Secondary | ICD-10-CM | POA: Insufficient documentation

## 2019-10-09 HISTORY — DX: Radiculopathy, lumbar region: M54.16

## 2019-10-09 HISTORY — DX: Other intervertebral disc degeneration, lumbar region: M51.36

## 2019-10-09 HISTORY — DX: Other intervertebral disc degeneration, lumbar region without mention of lumbar back pain or lower extremity pain: M51.369

## 2019-10-24 DIAGNOSIS — K219 Gastro-esophageal reflux disease without esophagitis: Secondary | ICD-10-CM | POA: Diagnosis not present

## 2019-10-24 DIAGNOSIS — Z6829 Body mass index (BMI) 29.0-29.9, adult: Secondary | ICD-10-CM | POA: Diagnosis not present

## 2019-10-24 DIAGNOSIS — Z79899 Other long term (current) drug therapy: Secondary | ICD-10-CM | POA: Diagnosis not present

## 2019-11-08 DIAGNOSIS — M5136 Other intervertebral disc degeneration, lumbar region: Secondary | ICD-10-CM | POA: Diagnosis not present

## 2019-11-08 DIAGNOSIS — M5416 Radiculopathy, lumbar region: Secondary | ICD-10-CM | POA: Diagnosis not present

## 2019-11-08 DIAGNOSIS — Z6827 Body mass index (BMI) 27.0-27.9, adult: Secondary | ICD-10-CM

## 2019-11-08 HISTORY — DX: Body mass index (BMI) 27.0-27.9, adult: Z68.27

## 2019-12-13 DIAGNOSIS — N401 Enlarged prostate with lower urinary tract symptoms: Secondary | ICD-10-CM | POA: Diagnosis not present

## 2019-12-13 DIAGNOSIS — N503 Cyst of epididymis: Secondary | ICD-10-CM | POA: Diagnosis not present

## 2019-12-13 DIAGNOSIS — N138 Other obstructive and reflux uropathy: Secondary | ICD-10-CM | POA: Diagnosis not present

## 2019-12-17 DIAGNOSIS — M5416 Radiculopathy, lumbar region: Secondary | ICD-10-CM | POA: Diagnosis not present

## 2020-01-02 ENCOUNTER — Other Ambulatory Visit: Payer: Self-pay

## 2020-01-02 DIAGNOSIS — I10 Essential (primary) hypertension: Secondary | ICD-10-CM

## 2020-01-02 DIAGNOSIS — E782 Mixed hyperlipidemia: Secondary | ICD-10-CM

## 2020-01-02 MED ORDER — METOPROLOL SUCCINATE ER 25 MG PO TB24: 12.5000 mg | ORAL_TABLET | Freq: Every day | ORAL | 0 refills | Status: DC

## 2020-01-02 MED ORDER — ATORVASTATIN CALCIUM 20 MG PO TABS: ORAL_TABLET | ORAL | 0 refills | Status: DC

## 2020-01-02 MED ORDER — LISINOPRIL 10 MG PO TABS: ORAL_TABLET | ORAL | 0 refills | Status: DC

## 2020-01-15 DIAGNOSIS — S62336A Displaced fracture of neck of fifth metacarpal bone, right hand, initial encounter for closed fracture: Secondary | ICD-10-CM | POA: Diagnosis not present

## 2020-01-16 DIAGNOSIS — S62336A Displaced fracture of neck of fifth metacarpal bone, right hand, initial encounter for closed fracture: Secondary | ICD-10-CM | POA: Diagnosis not present

## 2020-02-01 DIAGNOSIS — S62336A Displaced fracture of neck of fifth metacarpal bone, right hand, initial encounter for closed fracture: Secondary | ICD-10-CM | POA: Diagnosis not present

## 2020-02-21 DIAGNOSIS — K648 Other hemorrhoids: Secondary | ICD-10-CM

## 2020-02-21 HISTORY — DX: Other hemorrhoids: K64.8

## 2020-02-22 DIAGNOSIS — S62336A Displaced fracture of neck of fifth metacarpal bone, right hand, initial encounter for closed fracture: Secondary | ICD-10-CM | POA: Diagnosis not present

## 2020-03-18 DIAGNOSIS — S62336A Displaced fracture of neck of fifth metacarpal bone, right hand, initial encounter for closed fracture: Secondary | ICD-10-CM | POA: Diagnosis not present

## 2020-04-18 ENCOUNTER — Other Ambulatory Visit: Payer: Self-pay

## 2020-04-18 DIAGNOSIS — N400 Enlarged prostate without lower urinary tract symptoms: Secondary | ICD-10-CM

## 2020-04-18 HISTORY — DX: Benign prostatic hyperplasia without lower urinary tract symptoms: N40.0

## 2020-04-21 ENCOUNTER — Ambulatory Visit (INDEPENDENT_AMBULATORY_CARE_PROVIDER_SITE_OTHER): Payer: PPO | Admitting: Cardiology

## 2020-04-21 ENCOUNTER — Other Ambulatory Visit: Payer: Self-pay

## 2020-04-21 ENCOUNTER — Encounter: Payer: Self-pay | Admitting: Cardiology

## 2020-04-21 VITALS — BP 130/92 | HR 67 | Ht 69.0 in | Wt 187.0 lb

## 2020-04-21 DIAGNOSIS — Z87891 Personal history of nicotine dependence: Secondary | ICD-10-CM

## 2020-04-21 DIAGNOSIS — I10 Essential (primary) hypertension: Secondary | ICD-10-CM | POA: Diagnosis not present

## 2020-04-21 DIAGNOSIS — E782 Mixed hyperlipidemia: Secondary | ICD-10-CM | POA: Diagnosis not present

## 2020-04-21 DIAGNOSIS — I2510 Atherosclerotic heart disease of native coronary artery without angina pectoris: Secondary | ICD-10-CM | POA: Diagnosis not present

## 2020-04-21 NOTE — Progress Notes (Signed)
Cardiology Office Note:    Date:  04/21/2020   ID:  Brandon Wong, DOB 12-09-1955, MRN [id number]  PCP:  Helen Hashimoto., MD  Cardiologist:  Jenean Lindau, MD   Referring MD: Helen Hashimoto., MD    ASSESSMENT:    1. Coronary artery disease involving native coronary artery of native heart without angina pectoris   2. Essential hypertension   3. Ex-smoker   4. Mixed dyslipidemia    PLAN:    In order of problems listed above:  1. Coronary artery disease: Secondary prevention stressed with the patient.  Importance of compliance with diet and medication stressed and I told him to walk at least 30 minutes a day 5 days a week and he promises to do so. 2. Essential hypertension: Blood pressure is stable 3. Mixed dyslipidemia: Diet was emphasized lipids from the past were reviewed and we will recheck them in the next few days he will be back for it. 4. Patient will be seen in follow-up appointment in 6 months or earlier if the patient has any concerns    Medication Adjustments/Labs and Tests Ordered: Current medicines are reviewed at length with the patient today.  Concerns regarding medicines are outlined above.  Orders Placed This Encounter  Procedures  . Basic metabolic panel  . CBC with Differential/Platelet  . Hepatic function panel  . Lipid panel  . TSH   No orders of the defined types were placed in this encounter.    Chief Complaint  Patient presents with  . Follow-up      History of Present Illness:    Brandon Wong is a 64 y.o. male.  Patient has past medical history of coronary artery disease essential hypertension and dyslipidemia.  He denies any problems at this time and takes care of activities of daily living.  No chest pain orthopnea or PND.  He exercises on a regular basis.  He is here for routine follow-up visit.  Past Medical History:  Diagnosis Date  . Anxiety and depression   . ANXIETY DEPRESSION 05/10/2008   Qualifier:  Diagnosis of  By: [place], [place]    . ARTHRITIS 04/14/2009   Qualifier: Diagnosis of  By: Plotnikov MD, Evie Lacks   . Benign prostatic hyperplasia 04/18/2020  . BREAST MASS, RIGHT 06/13/2008   Qualifier: Diagnosis of  By: Plotnikov MD, Evie Lacks   . Cellulitis and abscess of unspecified site 06/13/2008   Qualifier: Diagnosis of  By: Plotnikov MD, Evie Lacks   . Chronic bilateral low back pain with right-sided sciatica 11/21/2007   Qualifier: Diagnosis of  By: Plotnikov MD, Evie Lacks   . Chronic headache    pt states no longer an issue  . COLONIC POLYPS, HX OF 11/21/2007   Qualifier: Diagnosis of  By: Plotnikov MD, Evie Lacks   . Coronary artery disease involving native coronary artery of native heart without angina pectoris 06/15/2017  . Degenerative disc disease, cervical   . DEGENERATIVE DISC DISEASE, CERVICAL SPINE 05/10/2008   Qualifier: Diagnosis of  By: Danny Lawless CMA, [place]    . Dyslipidemia   . Epididymal cyst 07/06/2019  . Essential hypertension 05/10/2008   Qualifier: Diagnosis of  By: Danny Lawless CMA, [place]    . Ex-smoker 07/12/2018  . External hemorrhoids   . Family history of acute myocardial infarction   . Gastritis    in the past per pt   . GASTRITIS 05/10/2008   Qualifier: History of  By: Danny Lawless CMA,  [place]    . GERD 05/10/2008   Qualifier: Diagnosis of  By: [place], [place]    . GERD (gastroesophageal reflux disease)    in the past per pt   . HEADACHE, CHRONIC, HX OF 05/10/2008   Qualifier: Diagnosis of  By: Danny Lawless CMA, [place]    . HEMORRHOIDS, EXTERNAL 05/10/2008   Qualifier: History of  By: Danny Lawless CMA, [place]    . Hemorrhoids, internal, with bleeding 02/21/2020  . History of colonic polyps   . Hypertension   . Impingement syndrome of right shoulder   . KNEE PAIN, LEFT 09/12/2009   Qualifier: Diagnosis of  By: Jenny Reichmann MD, Hunt Oris   . Lateral epicondylitis  of elbow   . Low back pain   . Lumbosacral radiculopathy at L5 08/28/2019  . Meralgia paresthetica   . METHICILLIN  RESISTANT STAPHYLOCOCCUS AUREUS INFECTION 06/13/2008   Qualifier: Diagnosis of  By: Plotnikov MD, Evie Lacks   . Mixed dyslipidemia 05/10/2008   Qualifier: Diagnosis of  By: Danny Lawless CMA, [place]    . MRSA (methicillin resistant Staphylococcus aureus) 2009   Skin  . Osteoarthritis   . OSTEOARTHRITIS 11/21/2007   Qualifier: Diagnosis of  By: Plotnikov MD, Evie Lacks   . Plantar fasciitis 08/04/2017  . Psoriasis    Possible psoriatic arthritis 2010  . Renal calculus   . RENAL CALCULUS, HX OF 05/10/2008   Qualifier: Diagnosis of  By: [place], [place]    . SHOULDER IMPINGEMENT SYNDROME, RIGHT 05/10/2008   Qualifier: History of  By: Danny Lawless CMA, [place]    . SKIN RASH 04/14/2009   Qualifier: Diagnosis of  By: Plotnikov MD, Evie Lacks Sleep disorder    pt states he doesn't have this anymore  . SLEEP DISORDER, HX OF 05/10/2008   Qualifier: Diagnosis of  By: [place], [place]    . Tubular adenoma of colon 02/25/2016    Past Surgical History:  Procedure Laterality Date  . COLONOSCOPY    . CORONARY ANGIOPLASTY WITH STENT PLACEMENT  2013   3 stents  . FINGER SURGERY    . POLYPECTOMY    . ROTATOR CUFF REPAIR     right    Current Medications: Current Meds  Medication Sig  . aspirin EC 81 MG tablet Take 81 mg by mouth daily.  Marland Kitchen atorvastatin (LIPITOR) 20 MG tablet TAKE 1 TABLET BY MOUTH TWICE (2) DAILY  . cetirizine (ZYRTEC) 10 MG tablet Take 10 mg by mouth daily.  . diazepam (VALIUM) 5 MG tablet Take 5 mg by mouth as needed.   Marland Kitchen esomeprazole (NEXIUM) 40 MG capsule Take 40 mg by mouth daily.  Marland Kitchen lisinopril (ZESTRIL) 10 MG tablet TAKE 1 TABLET BY MOUTH ONCE (1) DAILY  . metoprolol succinate (TOPROL-XL) 25 MG 24 hr tablet Take 0.5 tablets (12.5 mg total) by mouth daily.  . tadalafil (CIALIS) 5 MG tablet Take 5 mg by mouth daily as needed for erectile dysfunction.     Allergies:   Patient has no known allergies.   Social History   Socioeconomic History  . Marital status: Married    Spouse  name: Not on file  . Number of children: 4  . Years of education: Not on file  . Highest education level: Not on file  Occupational History  . Not on file  Tobacco Use  . Smoking status: Former Smoker    Packs/day: 0.50    Types: Cigarettes    Quit date: 06/15/2012    Years since quitting: 7.8  .  Smokeless tobacco: Never Used  Substance and Sexual Activity  . Alcohol use: Never  . Drug use: Never  . Sexual activity: Not on file  Other Topics Concern  . Not on file  Social History Narrative   Lives at home with wife & 2 sons   Right handed   Social Determinants of Health   Financial Resource Strain:   . Difficulty of Paying Living Expenses:   Food Insecurity:   . Worried About Charity fundraiser in the Last Year:   . Arboriculturist in the Last Year:   Transportation Needs:   . Film/video editor (Medical):   Marland Kitchen Lack of Transportation (Non-Medical):   Physical Activity:   . Days of Exercise per Week:   . Minutes of Exercise per Session:   Stress:   . Feeling of Stress :   Social Connections:   . Frequency of Communication with Friends and Family:   . Frequency of Social Gatherings with Friends and Family:   . Attends Religious Services:   . Active Member of Clubs or Organizations:   . Attends Archivist Meetings:   Marland Kitchen Marital Status:      Family History: The patient's family history includes Heart attack in his mother; Heart disease in his mother.  ROS:   Please see the history of present illness.    All other systems reviewed and are negative.  EKGs/Labs/Other Studies Reviewed:    The following studies were reviewed today: I discussed my findings with the patient at length   Recent Labs: 07/16/2019: ALT 31; BUN 13; Creatinine, Ser 0.83; Hemoglobin 14.2; Platelets 318; Potassium 4.6; Sodium 140; TSH 1.010  Recent Lipid Panel    Component Value Date/Time   CHOL 128 07/16/2019 1158   TRIG 75 07/16/2019 1158   HDL 43 07/16/2019 1158   CHOLHDL  3.0 07/16/2019 1158   CHOLHDL 6.5 CALC 05/30/2007 0918   VLDL 15 05/30/2007 0918   LDLCALC 70 07/16/2019 1158    Physical Exam:    VS:  BP (!) 130/92 (BP Location: Right Arm, Patient Position: Sitting, Cuff Size: Normal)   Pulse 67   Ht 5\' 9"  (1.753 m)   Wt 187 lb (84.8 kg)   SpO2 97%   BMI 27.62 kg/m     Wt Readings from Last 3 Encounters:  04/21/20 187 lb (84.8 kg)  08/28/19 181 lb (82.1 kg)  07/16/19 179 lb (81.2 kg)     GEN: Patient is in no acute distress HEENT: Normal NECK: No JVD; No carotid bruits LYMPHATICS: No lymphadenopathy CARDIAC: Hear sounds regular, 2/6 systolic murmur at the apex. RESPIRATORY:  Clear to auscultation without rales, wheezing or rhonchi  ABDOMEN: Soft, non-tender, non-distended MUSCULOSKELETAL:  No edema; No deformity  SKIN: Warm and dry NEUROLOGIC:  Alert and oriented x 3 PSYCHIATRIC:  Normal affect   Signed, Jenean Lindau, MD  04/21/2020 3:51 PM     [facility]

## 2020-04-21 NOTE — Patient Instructions (Signed)
Medication Instructions:  No medication changes *If you need a refill on your cardiac medications before your next appointment, please call your pharmacy*   Lab Work: Your physician recommends that you have a BMET, CBC, TSH, LFT and Lipids done today. If you have labs (blood work) drawn today and your tests are completely normal, you will receive your results only by: Marland Kitchen MyChart Message (if you have MyChart) OR . A paper copy in the mail If you have any lab test that is abnormal or we need to change your treatment, we will call you to review the results.   Testing/Procedures: None ordered   Follow-Up: At [facility], you and your health needs are our priority.  As part of our continuing mission to provide you with exceptional heart care, we have created designated Provider Care Teams.  These Care Teams include your primary Cardiologist (physician) and Advanced Practice Providers (APPs -  Physician Assistants and Nurse Practitioners) who all work together to provide you with the care you need, when you need it.  We recommend signing up for the patient portal called "MyChart".  Sign up information is provided on this After Visit Summary.  MyChart is used to connect with patients for Virtual Visits (Telemedicine).  Patients are able to view lab/test results, encounter notes, upcoming appointments, etc.  Non-urgent messages can be sent to your provider as well.   To learn more about what you can do with MyChart, go to [email or web address].    Your next appointment:   6 month(s)  The format for your next appointment:   In Person  Provider:   Jyl Heinz, MD   Other Instructions NA

## 2020-04-23 DIAGNOSIS — E782 Mixed hyperlipidemia: Secondary | ICD-10-CM | POA: Diagnosis not present

## 2020-04-23 DIAGNOSIS — I10 Essential (primary) hypertension: Secondary | ICD-10-CM | POA: Diagnosis not present

## 2020-04-23 DIAGNOSIS — I2510 Atherosclerotic heart disease of native coronary artery without angina pectoris: Secondary | ICD-10-CM | POA: Diagnosis not present

## 2020-04-24 LAB — HEPATIC FUNCTION PANEL
ALT: 24 IU/L (ref 0–44)
AST: 20 IU/L (ref 0–40)
Albumin: 4.1 g/dL (ref 3.8–4.8)
Alkaline Phosphatase: 79 IU/L (ref 48–121)
Bilirubin Total: <0.2 mg/dL (ref 0.0–1.2)
Bilirubin, Direct: 0.06 mg/dL (ref 0.00–0.40)
Total Protein: 6.2 g/dL (ref 6.0–8.5)

## 2020-04-24 LAB — CBC WITH DIFFERENTIAL/PLATELET
Basophils Absolute: 0.1 10*3/uL (ref 0.0–0.2)
Basos: 1 %
EOS (ABSOLUTE): 0.1 10*3/uL (ref 0.0–0.4)
Eos: 2 %
Hematocrit: 42.2 % (ref 37.5–51.0)
Hemoglobin: 14.3 g/dL (ref 13.0–17.7)
Immature Grans (Abs): 0 10*3/uL (ref 0.0–0.1)
Immature Granulocytes: 0 %
Lymphocytes Absolute: 1.5 10*3/uL (ref 0.7–3.1)
Lymphs: 34 %
MCH: 30.1 pg (ref 26.6–33.0)
MCHC: 33.9 g/dL (ref 31.5–35.7)
MCV: 89 fL (ref 79–97)
Monocytes Absolute: 0.5 10*3/uL (ref 0.1–0.9)
Monocytes: 10 %
Neutrophils Absolute: 2.4 10*3/uL (ref 1.4–7.0)
Neutrophils: 53 %
Platelets: 273 10*3/uL (ref 150–450)
RBC: 4.75 x10E6/uL (ref 4.14–5.80)
RDW: 12.4 % (ref 11.6–15.4)
WBC: 4.5 10*3/uL (ref 3.4–10.8)

## 2020-04-24 LAB — TSH: TSH: 1.28 u[IU]/mL (ref 0.450–4.500)

## 2020-04-24 LAB — BASIC METABOLIC PANEL
BUN/Creatinine Ratio: 15 (ref 10–24)
BUN: 13 mg/dL (ref 8–27)
CO2: 24 mmol/L (ref 20–29)
Calcium: 9 mg/dL (ref 8.6–10.2)
Chloride: 105 mmol/L (ref 96–106)
Creatinine, Ser: 0.86 mg/dL (ref 0.76–1.27)
GFR calc Af Amer: 108 mL/min/{1.73_m2} (ref >59)
GFR calc non Af Amer: 94 mL/min/{1.73_m2} (ref >59)
Glucose: 93 mg/dL (ref 65–99)
Potassium: 4.4 mmol/L (ref 3.5–5.2)
Sodium: 141 mmol/L (ref 134–144)

## 2020-04-24 LAB — LIPID PANEL
Chol/HDL Ratio: 3.7 ratio (ref 0.0–5.0)
Cholesterol, Total: 125 mg/dL (ref 100–199)
HDL: 34 mg/dL — ABNORMAL LOW (ref >39)
LDL Chol Calc (NIH): 72 mg/dL (ref 0–99)
Triglycerides: 102 mg/dL (ref 0–149)
VLDL Cholesterol Cal: 19 mg/dL (ref 5–40)

## 2020-05-08 DIAGNOSIS — K219 Gastro-esophageal reflux disease without esophagitis: Secondary | ICD-10-CM | POA: Diagnosis not present

## 2020-05-08 DIAGNOSIS — R109 Unspecified abdominal pain: Secondary | ICD-10-CM | POA: Diagnosis not present

## 2020-05-08 DIAGNOSIS — Z6829 Body mass index (BMI) 29.0-29.9, adult: Secondary | ICD-10-CM | POA: Diagnosis not present

## 2020-05-08 DIAGNOSIS — K5909 Other constipation: Secondary | ICD-10-CM | POA: Diagnosis not present

## 2020-05-15 DIAGNOSIS — Z79899 Other long term (current) drug therapy: Secondary | ICD-10-CM | POA: Diagnosis not present

## 2020-05-15 DIAGNOSIS — M5414 Radiculopathy, thoracic region: Secondary | ICD-10-CM | POA: Diagnosis not present

## 2020-05-15 DIAGNOSIS — Z6829 Body mass index (BMI) 29.0-29.9, adult: Secondary | ICD-10-CM | POA: Diagnosis not present

## 2020-06-04 DIAGNOSIS — J45901 Unspecified asthma with (acute) exacerbation: Secondary | ICD-10-CM | POA: Diagnosis not present

## 2020-06-04 DIAGNOSIS — Z6829 Body mass index (BMI) 29.0-29.9, adult: Secondary | ICD-10-CM | POA: Diagnosis not present

## 2020-06-04 DIAGNOSIS — Z79899 Other long term (current) drug therapy: Secondary | ICD-10-CM | POA: Diagnosis not present

## 2020-06-19 ENCOUNTER — Other Ambulatory Visit: Payer: Self-pay | Admitting: Cardiology

## 2020-06-19 DIAGNOSIS — E782 Mixed hyperlipidemia: Secondary | ICD-10-CM

## 2020-06-19 NOTE — Telephone Encounter (Signed)
*  STAT* If patient is at the pharmacy, call can be transferred to refill team.   1. Which medications need to be refilled? (please list name of each medication and dose if known)  Atorvastatin  2. Which pharmacy/location (including street and city if local pharmacy) is medication to be sent to? [facility] [address] ,[place]  3. Do they need a 30 day or 90 day supply?  90 days and refills

## 2020-07-02 DIAGNOSIS — Z9181 History of falling: Secondary | ICD-10-CM | POA: Diagnosis not present

## 2020-07-02 DIAGNOSIS — Z0000 Encounter for general adult medical examination without abnormal findings: Secondary | ICD-10-CM | POA: Diagnosis not present

## 2020-07-02 DIAGNOSIS — Z1331 Encounter for screening for depression: Secondary | ICD-10-CM | POA: Diagnosis not present

## 2020-07-30 ENCOUNTER — Telehealth: Payer: Self-pay | Admitting: Cardiology

## 2020-07-30 DIAGNOSIS — Z6828 Body mass index (BMI) 28.0-28.9, adult: Secondary | ICD-10-CM | POA: Diagnosis not present

## 2020-07-30 DIAGNOSIS — E663 Overweight: Secondary | ICD-10-CM | POA: Diagnosis not present

## 2020-07-30 DIAGNOSIS — K219 Gastro-esophageal reflux disease without esophagitis: Secondary | ICD-10-CM | POA: Diagnosis not present

## 2020-07-30 NOTE — Telephone Encounter (Signed)
Dose of Atorvastatin was verified with pt. Pt is taking 20 mg tiwce daily. Pt was seen today by his PCP for same and was started in a medication for his stomach. Pt verbalized understanding and had no additional questions.

## 2020-07-30 NOTE — Telephone Encounter (Signed)
Pt c/o medication issue:  1. Name of Medication: atorvastatin (LIPITOR) 20 MG tablet  2. How are you currently taking this medication (dosage and times per day)? 2 tablets 2x daily  3. Are you having a reaction (difficulty breathing--STAT)? GI issues (stomach aches w/o vomiting)  4. What is your medication issue? Pt wanted to make sure he is taking the correct dosage of the medication.

## 2020-08-13 ENCOUNTER — Telehealth: Payer: Self-pay | Admitting: Cardiology

## 2020-08-13 NOTE — Telephone Encounter (Signed)
New message:     Patient calling stating that he is taking 81 mg aspirin and he is taking them at same time together. Patient stomach is starting to bother him and would like to know if he need to take one in the morning and one at night. Please call patient back.

## 2020-08-13 NOTE — Telephone Encounter (Signed)
Left message on patients voicemail to please return our call.   

## 2020-08-14 NOTE — Telephone Encounter (Signed)
Spoke to patient just now and let him know Dr. Julien Nordmann recommendations. He verbalizes understanding and thanks me for the call back.

## 2020-08-14 NOTE — Telephone Encounter (Signed)
One is good

## 2020-08-14 NOTE — Telephone Encounter (Signed)
Pt c/o medication issue:  1. Name of Medication: aspirin EC 81 MG tablet  2. How are you currently taking this medication (dosage and times per day)? Two tablets by mouth in the morning   3. Are you having a reaction (difficulty breathing--STAT)? Yes   4. What is your medication issue? Brandon Wong is calling back returning Morgan's call to further explain his question as requested. He states he takes two tablets in the morning at the same time, but states it is bothering his stomach. He is wanting to know if he can take one tablet in the morning, and then take the other at night so it is more spaced out and not all at one time. Please advise.

## 2020-08-27 DIAGNOSIS — Z79899 Other long term (current) drug therapy: Secondary | ICD-10-CM | POA: Diagnosis not present

## 2020-08-27 DIAGNOSIS — F331 Major depressive disorder, recurrent, moderate: Secondary | ICD-10-CM | POA: Diagnosis not present

## 2020-08-27 DIAGNOSIS — Z6828 Body mass index (BMI) 28.0-28.9, adult: Secondary | ICD-10-CM | POA: Diagnosis not present

## 2020-09-10 DIAGNOSIS — Z79899 Other long term (current) drug therapy: Secondary | ICD-10-CM | POA: Diagnosis not present

## 2020-09-10 DIAGNOSIS — F331 Major depressive disorder, recurrent, moderate: Secondary | ICD-10-CM | POA: Diagnosis not present

## 2020-09-10 DIAGNOSIS — Z6828 Body mass index (BMI) 28.0-28.9, adult: Secondary | ICD-10-CM | POA: Diagnosis not present

## 2020-09-18 DIAGNOSIS — M5416 Radiculopathy, lumbar region: Secondary | ICD-10-CM | POA: Diagnosis not present

## 2020-09-22 ENCOUNTER — Other Ambulatory Visit: Payer: Self-pay | Admitting: Cardiology

## 2020-09-22 DIAGNOSIS — I10 Essential (primary) hypertension: Secondary | ICD-10-CM

## 2020-09-23 DIAGNOSIS — K219 Gastro-esophageal reflux disease without esophagitis: Secondary | ICD-10-CM | POA: Diagnosis not present

## 2020-09-24 DIAGNOSIS — Z6828 Body mass index (BMI) 28.0-28.9, adult: Secondary | ICD-10-CM | POA: Diagnosis not present

## 2020-09-24 DIAGNOSIS — Z23 Encounter for immunization: Secondary | ICD-10-CM | POA: Diagnosis not present

## 2020-09-24 DIAGNOSIS — F331 Major depressive disorder, recurrent, moderate: Secondary | ICD-10-CM | POA: Diagnosis not present

## 2020-09-24 DIAGNOSIS — Z79899 Other long term (current) drug therapy: Secondary | ICD-10-CM | POA: Diagnosis not present

## 2020-09-24 NOTE — Telephone Encounter (Signed)
Follow up:       Pharmacy calling to check the status of the refill patient is all out of medications.

## 2020-10-13 DIAGNOSIS — Z1159 Encounter for screening for other viral diseases: Secondary | ICD-10-CM | POA: Diagnosis not present

## 2020-10-13 DIAGNOSIS — Z1152 Encounter for screening for COVID-19: Secondary | ICD-10-CM | POA: Diagnosis not present

## 2020-10-20 ENCOUNTER — Other Ambulatory Visit: Payer: Self-pay

## 2020-10-20 DIAGNOSIS — I10 Essential (primary) hypertension: Secondary | ICD-10-CM | POA: Insufficient documentation

## 2020-10-20 DIAGNOSIS — M5030 Other cervical disc degeneration, unspecified cervical region: Secondary | ICD-10-CM | POA: Insufficient documentation

## 2020-10-20 DIAGNOSIS — E785 Hyperlipidemia, unspecified: Secondary | ICD-10-CM | POA: Insufficient documentation

## 2020-10-20 DIAGNOSIS — M7541 Impingement syndrome of right shoulder: Secondary | ICD-10-CM | POA: Insufficient documentation

## 2020-10-20 DIAGNOSIS — I2510 Atherosclerotic heart disease of native coronary artery without angina pectoris: Secondary | ICD-10-CM

## 2020-10-20 DIAGNOSIS — K449 Diaphragmatic hernia without obstruction or gangrene: Secondary | ICD-10-CM | POA: Diagnosis not present

## 2020-10-20 DIAGNOSIS — E782 Mixed hyperlipidemia: Secondary | ICD-10-CM

## 2020-10-20 DIAGNOSIS — N200 Calculus of kidney: Secondary | ICD-10-CM | POA: Insufficient documentation

## 2020-10-20 DIAGNOSIS — M545 Low back pain, unspecified: Secondary | ICD-10-CM | POA: Insufficient documentation

## 2020-10-20 DIAGNOSIS — Z86010 Personal history of colonic polyps: Secondary | ICD-10-CM | POA: Insufficient documentation

## 2020-10-20 DIAGNOSIS — G479 Sleep disorder, unspecified: Secondary | ICD-10-CM | POA: Insufficient documentation

## 2020-10-20 DIAGNOSIS — M1990 Unspecified osteoarthritis, unspecified site: Secondary | ICD-10-CM | POA: Insufficient documentation

## 2020-10-20 DIAGNOSIS — K2970 Gastritis, unspecified, without bleeding: Secondary | ICD-10-CM | POA: Insufficient documentation

## 2020-10-20 DIAGNOSIS — Z8249 Family history of ischemic heart disease and other diseases of the circulatory system: Secondary | ICD-10-CM | POA: Insufficient documentation

## 2020-10-20 DIAGNOSIS — F32A Depression, unspecified: Secondary | ICD-10-CM | POA: Insufficient documentation

## 2020-10-20 DIAGNOSIS — L409 Psoriasis, unspecified: Secondary | ICD-10-CM | POA: Insufficient documentation

## 2020-10-20 DIAGNOSIS — K219 Gastro-esophageal reflux disease without esophagitis: Secondary | ICD-10-CM | POA: Diagnosis not present

## 2020-10-20 DIAGNOSIS — F419 Anxiety disorder, unspecified: Secondary | ICD-10-CM | POA: Insufficient documentation

## 2020-10-20 DIAGNOSIS — R519 Headache, unspecified: Secondary | ICD-10-CM | POA: Insufficient documentation

## 2020-10-20 DIAGNOSIS — K644 Residual hemorrhoidal skin tags: Secondary | ICD-10-CM | POA: Insufficient documentation

## 2020-10-21 ENCOUNTER — Ambulatory Visit (INDEPENDENT_AMBULATORY_CARE_PROVIDER_SITE_OTHER): Payer: PPO | Admitting: Cardiology

## 2020-10-21 ENCOUNTER — Other Ambulatory Visit: Payer: Self-pay

## 2020-10-21 ENCOUNTER — Encounter: Payer: Self-pay | Admitting: Cardiology

## 2020-10-21 VITALS — BP 134/86 | HR 75 | Ht 69.0 in | Wt 189.2 lb

## 2020-10-21 DIAGNOSIS — E782 Mixed hyperlipidemia: Secondary | ICD-10-CM | POA: Diagnosis not present

## 2020-10-21 DIAGNOSIS — R0600 Dyspnea, unspecified: Secondary | ICD-10-CM

## 2020-10-21 DIAGNOSIS — I10 Essential (primary) hypertension: Secondary | ICD-10-CM

## 2020-10-21 DIAGNOSIS — I2510 Atherosclerotic heart disease of native coronary artery without angina pectoris: Secondary | ICD-10-CM | POA: Diagnosis not present

## 2020-10-21 DIAGNOSIS — R0609 Other forms of dyspnea: Secondary | ICD-10-CM

## 2020-10-21 HISTORY — DX: Other forms of dyspnea: R06.09

## 2020-10-21 HISTORY — DX: Dyspnea, unspecified: R06.00

## 2020-10-21 NOTE — Patient Instructions (Addendum)
Medication Instructions:  No medication changes. *If you need a refill on your cardiac medications before your next appointment, please call your pharmacy*   Lab Work: None ordered If you have labs (blood work) drawn today and your tests are completely normal, you will receive your results only by: Marland Kitchen MyChart Message (if you have MyChart) OR . A paper copy in the mail If you have any lab test that is abnormal or we need to change your treatment, we will call you to review the results.   Testing/Procedures: Your physician has requested that you have a stress echocardiogram. For further information please visit [email or web address]. Please follow instruction sheet as given.  This will be scheduled at [facility].   Follow-Up: At [facility], you and your health needs are our priority.  As part of our continuing mission to provide you with exceptional heart care, we have created designated Provider Care Teams.  These Care Teams include your primary Cardiologist (physician) and Advanced Practice Providers (APPs -  Physician Assistants and Nurse Practitioners) who all work together to provide you with the care you need, when you need it.  We recommend signing up for the patient portal called "MyChart".  Sign up information is provided on this After Visit Summary.  MyChart is used to connect with patients for Virtual Visits (Telemedicine).  Patients are able to view lab/test results, encounter notes, upcoming appointments, etc.  Non-urgent messages can be sent to your provider as well.   To learn more about what you can do with MyChart, go to [email or web address].    Your next appointment:   6 month(s)  The format for your next appointment:   In Person  Provider:   Jyl Heinz, MD   Other Instructions  Exercise Stress Echocardiogram  An exercise stress echocardiogram is a test that checks how well your heart is working. For this test, you will walk on a treadmill to make  your heart beat faster. This test uses sound waves (ultrasound) and a computer to make pictures (images) of your heart. These pictures will be taken before you exercise and after you exercise. What happens before the procedure?  Follow instructions from your doctor about what you cannot eat or drink before the test.  Do not drink or eat anything that has caffeine in it. Stop having caffeine for 24 hours before the test.  Ask your doctor about changing or stopping your normal medicines. This is important if you take diabetes medicines or blood thinners. Ask your doctor if you should take your medicines with water before the test.  If you use an inhaler, bring it to the test.  Do not use any products that have nicotine or tobacco in them, such as cigarettes and e-cigarettes. Stop using them for 4 hours before the test. If you need help quitting, ask your doctor.  Wear comfortable shoes and clothing. What happens during the procedure?  You will be hooked up to a TV screen. Your doctor will watch the screen to see how fast your heart beats during the test.  Before you exercise, a computer will make a picture of your heart. To do this: ? A gel will be put on your chest. ? A wand will be moved over the gel. ? Sound waves from the wand will go to the computer to make the picture.  Your will start walking on a treadmill. The treadmill will start at a slow speed. It will get faster a little bit at a time. When  you walk faster, your heart will beat faster.  The treadmill will be stopped when your heart is working hard.  You will lie down right away so another picture of your heart can be taken.  The test will take 30-60 minutes. What happens after the procedure?  Your heart rate and blood pressure will be watched after the test.  If your doctor says that you can, you may: ? Eat what you usually eat. ? Do your normal activities. ? Take medicines like normal. Summary  An exercise stress  echocardiogram is a test that checks how well your heart is working.  Follow instructions about what you cannot eat or drink before the test. Ask your doctor if you should take your normal medicines before the test.  Stop having caffeine for 24 hours before the test. Do not use anything with nicotine or tobacco in it for 4 hours before the test.  A computer will take a picture of your heart before you walk on a treadmill. It will take another picture when you are done walking.  Your heart rate and blood pressure will be watched after the test. This information is not intended to replace advice given to you by your health care provider. Make sure you discuss any questions you have with your health care provider. Document Revised: 09/16/2016 Document Reviewed: 08/15/2016 Elsevier Patient Education  2020 [employer].

## 2020-10-21 NOTE — Progress Notes (Signed)
Cardiology Office Note:    Date:  10/21/2020   ID:  Brandon Wong, DOB 01-18-1956, MRN [id number]  PCP:  Helen Hashimoto., MD  Cardiologist:  Jenean Lindau, MD   Referring MD: Helen Hashimoto., MD    ASSESSMENT:    1. Essential hypertension   2. Coronary artery disease involving native coronary artery of native heart without angina pectoris   3. Mixed dyslipidemia   4. DOE (dyspnea on exertion)    PLAN:    In order of problems listed above:  1. Coronary artery disease: Secondary prevention stressed.  Importance of compliance with diet medication stressed and vocalized understanding. 2. Dyspnea on exertion: I would like to assess him especially in view of his young age and coronary artery disease with a stress test.  I discussed stress echo and is agreeable.  We will schedule him for one this week. 3. Essential hypertension: Blood pressure stable 4. Mixed dyslipidemia: Diet was emphasized and blood work was reviewed with him. 5. Patient will be seen in follow-up appointment in 6 months or earlier if the patient has any concerns    Medication Adjustments/Labs and Tests Ordered: Current medicines are reviewed at length with the patient today.  Concerns regarding medicines are outlined above.  No orders of the defined types were placed in this encounter.  No orders of the defined types were placed in this encounter.    No chief complaint on file.    History of Present Illness:    Brandon Wong is a 64 y.o. male.  Patient has past medical history of coronary artery disease, essential hypertension and dyslipidemia.  He is an active gentleman but does not exercise on a regular basis.  No chest pain orthopnea or PND however the patient mentions to me that does note increasing dyspnea on exertion.  This concerned him.  At the time of my evaluation he is alert awake oriented and in no distress.  Past Medical History:  Diagnosis Date  . Anxiety and depression     . ANXIETY DEPRESSION 05/10/2008   Qualifier: Diagnosis of  By: Brandon, Wong    . ARTHRITIS 04/14/2009   Qualifier: Diagnosis of  By: Plotnikov MD, Evie Wong   . Benign prostatic hyperplasia 04/18/2020  . BREAST MASS, RIGHT 06/13/2008   Qualifier: Diagnosis of  By: Plotnikov MD, Evie Wong   . Cellulitis and abscess of unspecified site 06/13/2008   Qualifier: Diagnosis of  By: Plotnikov MD, Evie Wong   . Chronic bilateral low back pain with right-sided sciatica 11/21/2007   Qualifier: Diagnosis of  By: Plotnikov MD, Evie Wong   . Chronic headache    pt states no longer an issue  . COLONIC POLYPS, HX OF 11/21/2007   Qualifier: Diagnosis of  By: Plotnikov MD, Evie Wong   . Coronary artery disease involving native coronary artery of native heart without angina pectoris 06/15/2017  . Degenerative disc disease, cervical   . DEGENERATIVE DISC DISEASE, CERVICAL SPINE 05/10/2008   Qualifier: Diagnosis of  By: Brandon Wong, Wong    . Dyslipidemia   . Epididymal cyst 07/06/2019  . Essential hypertension 05/10/2008   Qualifier: Diagnosis of  By: Brandon Wong, Wong    . Ex-smoker 07/12/2018  . External hemorrhoids   . Family history of acute myocardial infarction   . Gastritis    in the past per pt   . GASTRITIS 05/10/2008   Qualifier: History of  By: Brandon Wong, Wong    .  GERD 05/10/2008   Qualifier: Diagnosis of  By: Brandon Wong, Wong    . GERD (gastroesophageal reflux disease)    in the past per pt   . HEADACHE, CHRONIC, HX OF 05/10/2008   Qualifier: Diagnosis of  By: Brandon Wong, Wong    . HEMORRHOIDS, EXTERNAL 05/10/2008   Qualifier: History of  By: Brandon Wong, Wong    . Hemorrhoids, internal, with bleeding 02/21/2020  . History of colonic polyps   . Hypertension   . Impingement syndrome of right shoulder   . KNEE PAIN, LEFT 09/12/2009   Qualifier: Diagnosis of  By: Jenny Reichmann MD, Hunt Oris   . Lateral epicondylitis  of elbow   . Low back pain   . Lumbosacral radiculopathy at L5 08/28/2019  .  Meralgia paresthetica   . METHICILLIN RESISTANT STAPHYLOCOCCUS AUREUS INFECTION 06/13/2008   Qualifier: Diagnosis of  By: Plotnikov MD, Evie Wong   . Mixed dyslipidemia 05/10/2008   Qualifier: Diagnosis of  By: Brandon Wong, Wong    . MRSA (methicillin resistant Staphylococcus aureus) 2009   Skin  . Osteoarthritis   . OSTEOARTHRITIS 11/21/2007   Qualifier: Diagnosis of  By: Plotnikov MD, Evie Wong   . Plantar fasciitis 08/04/2017  . Psoriasis    Possible psoriatic arthritis 2010  . Renal calculus   . RENAL CALCULUS, HX OF 05/10/2008   Qualifier: Diagnosis of  By: Brandon Wong, Wong    . SHOULDER IMPINGEMENT SYNDROME, RIGHT 05/10/2008   Qualifier: History of  By: Brandon Wong, Wong    . SKIN RASH 04/14/2009   Qualifier: Diagnosis of  By: Plotnikov MD, Evie Wong Sleep disorder    pt states he doesn't have this anymore  . SLEEP DISORDER, HX OF 05/10/2008   Qualifier: Diagnosis of  By: Brandon, Wong    . Tubular adenoma of colon 02/25/2016    Past Surgical History:  Procedure Laterality Date  . COLONOSCOPY    . CORONARY ANGIOPLASTY WITH STENT PLACEMENT  2013   3 stents  . FINGER SURGERY    . POLYPECTOMY    . ROTATOR CUFF REPAIR     right    Current Medications: Current Meds  Medication Sig  . aspirin EC 81 MG tablet Take 81 mg by mouth daily.  Brandon Wong atorvastatin (LIPITOR) 20 MG tablet TAKE 1 TABLET BY MOUTH TWICE (2) DAILY  . cetirizine (ZYRTEC) 10 MG tablet Take 10 mg by mouth daily.  . diazepam (VALIUM) 5 MG tablet Take 5 mg by mouth as needed.   Brandon Wong esomeprazole (NEXIUM) 40 MG capsule Take 40 mg by mouth daily.  Brandon Wong lisinopril (ZESTRIL) 10 MG tablet TAKE 1 TABLET BY MOUTH ONCE (1) DAILY  . metoprolol succinate (TOPROL-XL) 25 MG 24 hr tablet TAKE 1/2 TABLET BY MOUTH DAILY  . tadalafil (CIALIS) 5 MG tablet Take 5 mg by mouth daily as needed for erectile dysfunction.  Brandon Wong VRAYLAR capsule      Allergies:   Patient has no known allergies.   Social History   Socioeconomic  History  . Marital status: Married    Spouse name: Not on file  . Number of children: 4  . Years of education: Not on file  . Highest education level: Not on file  Occupational History  . Not on file  Tobacco Use  . Smoking status: Former Smoker    Packs/day: 0.50    Types: Cigarettes    Quit date: 06/15/2012    Years since quitting: 8.3  . Smokeless  tobacco: Never Used  Vaping Use  . Vaping Use: Never used  Substance and Sexual Activity  . Alcohol use: Never  . Drug use: Never  . Sexual activity: Not on file  Other Topics Concern  . Not on file  Social History Narrative   Lives at home with wife & 2 sons   Right handed   Social Determinants of Health   Financial Resource Strain:   . Difficulty of Paying Living Expenses: Not on file  Food Insecurity:   . Worried About Charity fundraiser in the Last Year: Not on file  . Ran Out of Food in the Last Year: Not on file  Transportation Needs:   . Lack of Transportation (Medical): Not on file  . Lack of Transportation (Non-Medical): Not on file  Physical Activity:   . Days of Exercise per Week: Not on file  . Minutes of Exercise per Session: Not on file  Stress:   . Feeling of Stress : Not on file  Social Connections:   . Frequency of Communication with Friends and Family: Not on file  . Frequency of Social Gatherings with Friends and Family: Not on file  . Attends Religious Services: Not on file  . Active Member of Clubs or Organizations: Not on file  . Attends Archivist Meetings: Not on file  . Marital Status: Not on file     Family History: The patient's family history includes Heart attack in his mother; Heart disease in his mother.  ROS:   Please see the history of present illness.    All other systems reviewed and are negative.  EKGs/Labs/Other Studies Reviewed:    The following studies were reviewed today: I discussed my findings with the patient at length including lipids   Recent  Labs: 04/23/2020: ALT 24; BUN 13; Creatinine, Ser 0.86; Hemoglobin 14.3; Platelets 273; Potassium 4.4; Sodium 141; TSH 1.280  Recent Lipid Panel    Component Value Date/Time   CHOL 125 04/23/2020 0849   TRIG 102 04/23/2020 0849   HDL 34 (L) 04/23/2020 0849   CHOLHDL 3.7 04/23/2020 0849   CHOLHDL 6.5 CALC 05/30/2007 0918   VLDL 15 05/30/2007 0918   LDLCALC 72 04/23/2020 0849    Physical Exam:    VS:  BP 134/86   Pulse 75   Ht 5\' 9"  (1.753 m)   Wt 189 lb 3.2 oz (85.8 kg)   SpO2 94%   BMI 27.94 kg/m     Wt Readings from Last 3 Encounters:  10/21/20 189 lb 3.2 oz (85.8 kg)  04/21/20 187 lb (84.8 kg)  08/28/19 181 lb (82.1 kg)     GEN: Patient is in no acute distress HEENT: Normal NECK: No JVD; No carotid bruits LYMPHATICS: No lymphadenopathy CARDIAC: Hear sounds regular, 2/6 systolic murmur at the apex. RESPIRATORY:  Clear to auscultation without rales, wheezing or rhonchi  ABDOMEN: Soft, non-tender, non-distended MUSCULOSKELETAL:  No edema; No deformity  SKIN: Warm and dry NEUROLOGIC:  Alert and oriented x 3 PSYCHIATRIC:  Normal affect   Signed, Jenean Lindau, MD  10/21/2020 1:40 PM    [facility]

## 2020-10-22 ENCOUNTER — Telehealth: Payer: Self-pay | Admitting: Cardiology

## 2020-10-22 LAB — HEPATIC FUNCTION PANEL
ALT: 19 IU/L (ref 0–44)
AST: 14 IU/L (ref 0–40)
Albumin: 4 g/dL (ref 3.8–4.8)
Alkaline Phosphatase: 94 IU/L (ref 44–121)
Bilirubin Total: <0.2 mg/dL (ref 0.0–1.2)
Bilirubin, Direct: <0.1 mg/dL (ref 0.00–0.40)
Total Protein: 6.3 g/dL (ref 6.0–8.5)

## 2020-10-22 LAB — BASIC METABOLIC PANEL
BUN/Creatinine Ratio: 11 (ref 10–24)
BUN: 10 mg/dL (ref 8–27)
CO2: 24 mmol/L (ref 20–29)
Calcium: 9.4 mg/dL (ref 8.6–10.2)
Chloride: 104 mmol/L (ref 96–106)
Creatinine, Ser: 0.87 mg/dL (ref 0.76–1.27)
GFR calc Af Amer: 107 mL/min/{1.73_m2} (ref >59)
GFR calc non Af Amer: 92 mL/min/{1.73_m2} (ref >59)
Glucose: 84 mg/dL (ref 65–99)
Potassium: 4.3 mmol/L (ref 3.5–5.2)
Sodium: 141 mmol/L (ref 134–144)

## 2020-10-22 LAB — CBC WITH DIFFERENTIAL/PLATELET
Basophils Absolute: 0.1 10*3/uL (ref 0.0–0.2)
Basos: 1 %
EOS (ABSOLUTE): 0.1 10*3/uL (ref 0.0–0.4)
Eos: 2 %
Hematocrit: 40.4 % (ref 37.5–51.0)
Hemoglobin: 13.7 g/dL (ref 13.0–17.7)
Immature Grans (Abs): 0 10*3/uL (ref 0.0–0.1)
Immature Granulocytes: 1 %
Lymphocytes Absolute: 1.9 10*3/uL (ref 0.7–3.1)
Lymphs: 31 %
MCH: 30.7 pg (ref 26.6–33.0)
MCHC: 33.9 g/dL (ref 31.5–35.7)
MCV: 91 fL (ref 79–97)
Monocytes Absolute: 0.5 10*3/uL (ref 0.1–0.9)
Monocytes: 8 %
Neutrophils Absolute: 3.6 10*3/uL (ref 1.4–7.0)
Neutrophils: 57 %
Platelets: 322 10*3/uL (ref 150–450)
RBC: 4.46 x10E6/uL (ref 4.14–5.80)
RDW: 12.1 % (ref 11.6–15.4)
WBC: 6.2 10*3/uL (ref 3.4–10.8)

## 2020-10-22 LAB — TSH: TSH: 1.58 u[IU]/mL (ref 0.450–4.500)

## 2020-10-22 LAB — LIPID PANEL
Chol/HDL Ratio: 3 ratio (ref 0.0–5.0)
Cholesterol, Total: 125 mg/dL (ref 100–199)
HDL: 41 mg/dL (ref >39)
LDL Chol Calc (NIH): 69 mg/dL (ref 0–99)
Triglycerides: 77 mg/dL (ref 0–149)
VLDL Cholesterol Cal: 15 mg/dL (ref 5–40)

## 2020-10-22 NOTE — Telephone Encounter (Signed)
Pt advised to wait to until we get the okay from insurance to have covid test. Pt verbalized understanding and had no questions.

## 2020-10-22 NOTE — Telephone Encounter (Signed)
Patient states he is supposed to get his covid test prior to a procedure. He states he went to the Urgent Care but they would not do it. He would like to know if there is somewhere else he can go. - He is going to try [facility] but I told him I would put in a note with you so if that doesn't work out for him we will call him to [facility].

## 2020-10-22 NOTE — Telephone Encounter (Signed)
Patient states he is supposed to get his covid test prior to a procedure. He states he went to the Urgent Care but they would not do it. He would like to know if there is somewhere else he can go.

## 2020-10-22 NOTE — Telephone Encounter (Signed)
Brandon Wong is calling stating he did get a rapid covid test today and will bring it to the office tomorrow morning around 8:30 am. Please advise.

## 2020-10-24 ENCOUNTER — Encounter: Payer: Self-pay | Admitting: Cardiology

## 2020-10-24 DIAGNOSIS — R0600 Dyspnea, unspecified: Secondary | ICD-10-CM | POA: Diagnosis not present

## 2020-11-03 DIAGNOSIS — Z6830 Body mass index (BMI) 30.0-30.9, adult: Secondary | ICD-10-CM | POA: Diagnosis not present

## 2020-11-03 DIAGNOSIS — Z79899 Other long term (current) drug therapy: Secondary | ICD-10-CM | POA: Diagnosis not present

## 2020-11-03 DIAGNOSIS — F331 Major depressive disorder, recurrent, moderate: Secondary | ICD-10-CM | POA: Diagnosis not present

## 2020-11-03 DIAGNOSIS — F411 Generalized anxiety disorder: Secondary | ICD-10-CM | POA: Diagnosis not present

## 2020-11-11 DIAGNOSIS — M5416 Radiculopathy, lumbar region: Secondary | ICD-10-CM | POA: Diagnosis not present

## 2020-11-11 DIAGNOSIS — M5136 Other intervertebral disc degeneration, lumbar region: Secondary | ICD-10-CM | POA: Diagnosis not present

## 2020-11-18 DIAGNOSIS — K219 Gastro-esophageal reflux disease without esophagitis: Secondary | ICD-10-CM | POA: Diagnosis not present

## 2020-12-02 DIAGNOSIS — Z79899 Other long term (current) drug therapy: Secondary | ICD-10-CM | POA: Diagnosis not present

## 2020-12-02 DIAGNOSIS — Z6832 Body mass index (BMI) 32.0-32.9, adult: Secondary | ICD-10-CM | POA: Diagnosis not present

## 2020-12-02 DIAGNOSIS — F331 Major depressive disorder, recurrent, moderate: Secondary | ICD-10-CM | POA: Diagnosis not present

## 2020-12-02 DIAGNOSIS — I2510 Atherosclerotic heart disease of native coronary artery without angina pectoris: Secondary | ICD-10-CM | POA: Diagnosis not present

## 2020-12-02 DIAGNOSIS — F411 Generalized anxiety disorder: Secondary | ICD-10-CM | POA: Diagnosis not present

## 2020-12-02 DIAGNOSIS — I10 Essential (primary) hypertension: Secondary | ICD-10-CM | POA: Diagnosis not present

## 2020-12-02 DIAGNOSIS — Z125 Encounter for screening for malignant neoplasm of prostate: Secondary | ICD-10-CM | POA: Diagnosis not present

## 2020-12-11 DIAGNOSIS — M47817 Spondylosis without myelopathy or radiculopathy, lumbosacral region: Secondary | ICD-10-CM | POA: Diagnosis not present

## 2020-12-11 DIAGNOSIS — Z79899 Other long term (current) drug therapy: Secondary | ICD-10-CM | POA: Diagnosis not present

## 2020-12-30 DIAGNOSIS — N138 Other obstructive and reflux uropathy: Secondary | ICD-10-CM | POA: Diagnosis not present

## 2020-12-30 DIAGNOSIS — N401 Enlarged prostate with lower urinary tract symptoms: Secondary | ICD-10-CM | POA: Diagnosis not present

## 2021-01-19 ENCOUNTER — Ambulatory Visit (INDEPENDENT_AMBULATORY_CARE_PROVIDER_SITE_OTHER): Payer: PPO | Admitting: Psychologist

## 2021-01-19 DIAGNOSIS — F330 Major depressive disorder, recurrent, mild: Secondary | ICD-10-CM | POA: Diagnosis not present

## 2021-01-19 DIAGNOSIS — F411 Generalized anxiety disorder: Secondary | ICD-10-CM | POA: Diagnosis not present

## 2021-01-26 ENCOUNTER — Other Ambulatory Visit: Payer: Self-pay | Admitting: Cardiology

## 2021-01-26 DIAGNOSIS — E782 Mixed hyperlipidemia: Secondary | ICD-10-CM

## 2021-01-26 NOTE — Telephone Encounter (Signed)
Refill sent to pharmacy.   

## 2021-01-29 ENCOUNTER — Ambulatory Visit (INDEPENDENT_AMBULATORY_CARE_PROVIDER_SITE_OTHER): Payer: PPO | Admitting: Psychologist

## 2021-01-29 DIAGNOSIS — F411 Generalized anxiety disorder: Secondary | ICD-10-CM

## 2021-01-29 DIAGNOSIS — F330 Major depressive disorder, recurrent, mild: Secondary | ICD-10-CM | POA: Diagnosis not present

## 2021-02-06 ENCOUNTER — Telehealth: Payer: Self-pay

## 2021-02-06 NOTE — Telephone Encounter (Signed)
   [facility] Pre-operative Risk Assessment    HEARTCARE STAFF: - Please ensure there is not already an duplicate clearance open for this procedure. - Under Visit Info/Reason for Call, type in Other and utilize the format Clearance MM/DD/YY or Clearance TBD. Do not use dashes or single digits. - If request is for dental extraction, please clarify the # of teeth to be extracted.  Request for surgical clearance:  1. What type of surgery is being performed? Diagnostic Colonoscopy   2. When is this surgery scheduled? 04/02/2021   3. What type of clearance is required (medical clearance vs. Pharmacy clearance to hold med vs. Both)? Both  4. Are there any medications that need to be held prior to surgery and how long? Aspirin   5. Practice name and name of physician performing surgery? [facility]- / Dr Governor Rooks DO  6. What is the office phone number? [phone]   7.   What is the office fax number? [phone]  8.   Anesthesia type (None, local, MAC, general) ? Propofol   Toni Arthurs 02/06/2021, 12:13 PM  _________________________________________________________________   (provider comments below)

## 2021-02-06 NOTE — Telephone Encounter (Signed)
   Primary Cardiologist: Jenean Lindau, MD  Chart reviewed as part of pre-operative protocol coverage. Given past medical history and time since last visit, based on ACC/AHA guidelines, Brandon Wong would be at acceptable risk for the planned procedure without further cardiovascular testing. He had stress echo 10/2020 which was low risk with no evidence of ischemia.   He has history of CAD and has remained on Aspirin 81mg  daily. Ideally, continue aspirin throughout the perioperative period. However, permissible to hold if deemed necessary by gastroenterologist.  The patient was advised that if he develops new symptoms prior to surgery to contact our office to arrange for a follow-up visit, and he verbalized understanding.  I will route this recommendation to the requesting party via Epic fax function and remove from pre-op pool.  Please call with questions.  Loel Dubonnet, NP 02/06/2021, 12:31 PM

## 2021-02-10 ENCOUNTER — Other Ambulatory Visit: Payer: Self-pay | Admitting: Cardiology

## 2021-02-10 ENCOUNTER — Ambulatory Visit (INDEPENDENT_AMBULATORY_CARE_PROVIDER_SITE_OTHER): Payer: PPO | Admitting: Psychologist

## 2021-02-10 DIAGNOSIS — I10 Essential (primary) hypertension: Secondary | ICD-10-CM

## 2021-02-10 DIAGNOSIS — F330 Major depressive disorder, recurrent, mild: Secondary | ICD-10-CM

## 2021-02-10 DIAGNOSIS — F411 Generalized anxiety disorder: Secondary | ICD-10-CM | POA: Diagnosis not present

## 2021-03-03 DIAGNOSIS — F331 Major depressive disorder, recurrent, moderate: Secondary | ICD-10-CM | POA: Diagnosis not present

## 2021-03-03 DIAGNOSIS — I10 Essential (primary) hypertension: Secondary | ICD-10-CM | POA: Diagnosis not present

## 2021-03-03 DIAGNOSIS — Z6831 Body mass index (BMI) 31.0-31.9, adult: Secondary | ICD-10-CM | POA: Diagnosis not present

## 2021-03-03 DIAGNOSIS — Z79899 Other long term (current) drug therapy: Secondary | ICD-10-CM | POA: Diagnosis not present

## 2021-03-03 DIAGNOSIS — I2510 Atherosclerotic heart disease of native coronary artery without angina pectoris: Secondary | ICD-10-CM | POA: Diagnosis not present

## 2021-03-03 DIAGNOSIS — F411 Generalized anxiety disorder: Secondary | ICD-10-CM | POA: Diagnosis not present

## 2021-03-03 DIAGNOSIS — Z125 Encounter for screening for malignant neoplasm of prostate: Secondary | ICD-10-CM | POA: Diagnosis not present

## 2021-03-11 ENCOUNTER — Ambulatory Visit: Payer: PPO | Admitting: Psychologist

## 2021-03-17 NOTE — Telephone Encounter (Signed)
If they did not receive the initial Epic fax, I will resend via [employer].   If you will print the telephone encounter and fax to them manually, please.   Recommendations remain the same as previously mentioned.   Thanks, Laurann Montana, NP

## 2021-03-20 NOTE — Telephone Encounter (Signed)
No problem. Thank you for following up!  Loel Dubonnet, NP

## 2021-03-26 DIAGNOSIS — Z86010 Personal history of colonic polyps: Secondary | ICD-10-CM | POA: Diagnosis not present

## 2021-03-26 DIAGNOSIS — Z1211 Encounter for screening for malignant neoplasm of colon: Secondary | ICD-10-CM

## 2021-03-26 HISTORY — DX: Encounter for screening for malignant neoplasm of colon: Z12.11

## 2021-04-02 DIAGNOSIS — Z86010 Personal history of colonic polyps: Secondary | ICD-10-CM | POA: Diagnosis not present

## 2021-04-02 DIAGNOSIS — K621 Rectal polyp: Secondary | ICD-10-CM | POA: Diagnosis not present

## 2021-04-02 DIAGNOSIS — D123 Benign neoplasm of transverse colon: Secondary | ICD-10-CM | POA: Diagnosis not present

## 2021-04-02 DIAGNOSIS — Z7982 Long term (current) use of aspirin: Secondary | ICD-10-CM | POA: Diagnosis not present

## 2021-04-02 DIAGNOSIS — K648 Other hemorrhoids: Secondary | ICD-10-CM | POA: Diagnosis not present

## 2021-04-02 DIAGNOSIS — Z79899 Other long term (current) drug therapy: Secondary | ICD-10-CM | POA: Diagnosis not present

## 2021-04-02 DIAGNOSIS — K635 Polyp of colon: Secondary | ICD-10-CM | POA: Diagnosis not present

## 2021-04-02 DIAGNOSIS — Z1211 Encounter for screening for malignant neoplasm of colon: Secondary | ICD-10-CM | POA: Diagnosis not present

## 2021-04-10 ENCOUNTER — Other Ambulatory Visit: Payer: Self-pay

## 2021-04-15 DIAGNOSIS — M19011 Primary osteoarthritis, right shoulder: Secondary | ICD-10-CM | POA: Diagnosis not present

## 2021-04-20 ENCOUNTER — Encounter: Payer: Self-pay | Admitting: Cardiology

## 2021-04-20 ENCOUNTER — Other Ambulatory Visit: Payer: Self-pay

## 2021-04-20 ENCOUNTER — Ambulatory Visit (INDEPENDENT_AMBULATORY_CARE_PROVIDER_SITE_OTHER): Payer: PPO | Admitting: Cardiology

## 2021-04-20 VITALS — BP 142/82 | HR 70 | Ht 69.0 in | Wt 189.8 lb

## 2021-04-20 DIAGNOSIS — I2510 Atherosclerotic heart disease of native coronary artery without angina pectoris: Secondary | ICD-10-CM | POA: Diagnosis not present

## 2021-04-20 DIAGNOSIS — I10 Essential (primary) hypertension: Secondary | ICD-10-CM

## 2021-04-20 DIAGNOSIS — F411 Generalized anxiety disorder: Secondary | ICD-10-CM | POA: Diagnosis not present

## 2021-04-20 DIAGNOSIS — F331 Major depressive disorder, recurrent, moderate: Secondary | ICD-10-CM | POA: Diagnosis not present

## 2021-04-20 DIAGNOSIS — Z79899 Other long term (current) drug therapy: Secondary | ICD-10-CM | POA: Diagnosis not present

## 2021-04-20 DIAGNOSIS — E782 Mixed hyperlipidemia: Secondary | ICD-10-CM

## 2021-04-20 DIAGNOSIS — Z6831 Body mass index (BMI) 31.0-31.9, adult: Secondary | ICD-10-CM | POA: Diagnosis not present

## 2021-04-20 NOTE — Progress Notes (Signed)
Cardiology Office Note:    Date:  04/20/2021   ID:  Brandon Wong, DOB [birth year], MRN [id number]  PCP:  Helen Hashimoto., MD  Cardiologist:  Jenean Lindau, MD   Referring MD: Helen Hashimoto., MD    ASSESSMENT:    1. Essential hypertension   2. Coronary artery disease involving native coronary artery of native heart without angina pectoris   3. Mixed dyslipidemia    PLAN:    In order of problems listed above:  1. Coronary artery disease: Secondary prevention stressed with the patient.  Importance of compliance with diet medication stressed and he vocalized understanding.  He was advised to walk at least half an hour a day 5 days a week and he promises to do so. 2. Essential hypertension: Blood pressure stable and diet was emphasized.  Lifestyle modification was urged his blood pressure is fine at home.  He has an element of whitecoat hypertension. 3. Mixed dyslipidemia: Lipids were reviewed KPN sheet was reviewed.  I advised him about it.  I reviewed those reports.  He understands.  Healthy heart lifestyle was emphasized. 4. Patient will be seen in follow-up appointment in 6 months or earlier if the patient has any concerns    Medication Adjustments/Labs and Tests Ordered: Current medicines are reviewed at length with the patient today.  Concerns regarding medicines are outlined above.  No orders of the defined types were placed in this encounter.  No orders of the defined types were placed in this encounter.    No chief complaint on file.    History of Present Illness:    Brandon Wong is a 65 y.o. male.  Patient has known coronary artery disease, essential hypertension and dyslipidemia.  He denies any problems at this time and takes care of activities of daily living.  No chest pain orthopnea or PND.  At the time of my evaluation, the patient is alert awake oriented and in no distress.  He walks on a regular basis.  His stress test done recently was  unremarkable.  Past Medical History:  Diagnosis Date  . Anxiety and depression   . ANXIETY DEPRESSION 05/10/2008   Qualifier: Diagnosis of  By: [place], [place]    . ARTHRITIS 04/14/2009   Qualifier: Diagnosis of  By: Plotnikov MD, Evie Lacks   . Benign prostatic hyperplasia 04/18/2020  . Body mass index (BMI) 27.0-27.9, adult 11/08/2019  . BREAST MASS, RIGHT 06/13/2008   Qualifier: Diagnosis of  By: Plotnikov MD, Evie Lacks   . Cellulitis and abscess of unspecified site 06/13/2008   Qualifier: Diagnosis of  By: Plotnikov MD, Evie Lacks   . Chronic bilateral low back pain with right-sided sciatica 11/21/2007   Qualifier: Diagnosis of  By: Plotnikov MD, Evie Lacks   . Chronic headache    pt states no longer an issue  . COLONIC POLYPS, HX OF 11/21/2007   Qualifier: Diagnosis of  By: Plotnikov MD, Evie Lacks   . Coronary artery disease involving native coronary artery of native heart without angina pectoris 06/15/2017  . DDD (degenerative disc disease), lumbar 10/09/2019  . Degenerative disc disease, cervical   . DEGENERATIVE DISC DISEASE, CERVICAL SPINE 05/10/2008   Qualifier: Diagnosis of  By: Danny Lawless CMA, [place]    . DOE (dyspnea on exertion) 10/21/2020  . Dyslipidemia   . Epididymal cyst 07/06/2019  . Essential hypertension 05/10/2008   Qualifier: Diagnosis of  By: Danny Lawless CMA, [place]    . Ex-smoker 07/12/2018  . External hemorrhoids   .  Family history of acute myocardial infarction   . Gastritis    in the past per pt   . GASTRITIS 05/10/2008   Qualifier: History of  By: Danny Lawless CMA, [place]    . GERD 05/10/2008   Qualifier: Diagnosis of  By: Danny Lawless CMA, [place]    . GERD (gastroesophageal reflux disease)    in the past per pt   . HEADACHE, CHRONIC, HX OF 05/10/2008   Qualifier: Diagnosis of  By: Danny Lawless CMA, [place]    . HEMORRHOIDS, EXTERNAL 05/10/2008   Qualifier: History of  By: Danny Lawless CMA, [place]    . Hemorrhoids, internal, with bleeding 02/21/2020  . History of colonic polyps   .  Hypertension   . Impingement syndrome of right shoulder   . KNEE PAIN, LEFT 09/12/2009   Qualifier: Diagnosis of  By: Jenny Reichmann MD, Hunt Oris   . Lateral epicondylitis  of elbow   . Low back pain   . Lumbar radiculopathy 10/09/2019  . Lumbosacral radiculopathy at L5 08/28/2019  . Meralgia paresthetica   . METHICILLIN RESISTANT STAPHYLOCOCCUS AUREUS INFECTION 06/13/2008   Qualifier: Diagnosis of  By: Plotnikov MD, Evie Lacks   . Mixed dyslipidemia 05/10/2008   Qualifier: Diagnosis of  By: Danny Lawless CMA, [place]    . MRSA (methicillin resistant Staphylococcus aureus) 2009   Skin  . Osteoarthritis   . OSTEOARTHRITIS 11/21/2007   Qualifier: Diagnosis of  By: Plotnikov MD, Evie Lacks   . Plantar fasciitis 08/04/2017  . Psoriasis    Possible psoriatic arthritis 2010  . Renal calculus   . RENAL CALCULUS, HX OF 05/10/2008   Qualifier: Diagnosis of  By: [place], [place]    . Screening for malignant neoplasm of colon 03/26/2021  . SHOULDER IMPINGEMENT SYNDROME, RIGHT 05/10/2008   Qualifier: History of  By: Danny Lawless CMA, [place]    . SKIN RASH 04/14/2009   Qualifier: Diagnosis of  By: Plotnikov MD, Evie Lacks Sleep disorder    pt states he doesn't have this anymore  . SLEEP DISORDER, HX OF 05/10/2008   Qualifier: Diagnosis of  By: [place], [place]    . Tubular adenoma of colon 02/25/2016    Past Surgical History:  Procedure Laterality Date  . COLONOSCOPY    . CORONARY ANGIOPLASTY WITH STENT PLACEMENT  2013   3 stents  . FINGER SURGERY    . POLYPECTOMY    . ROTATOR CUFF REPAIR     right    Current Medications: Current Meds  Medication Sig  . aspirin EC 81 MG tablet Take 162 mg by mouth daily.  Marland Kitchen atorvastatin (LIPITOR) 20 MG tablet Take 20 mg by mouth daily.  . cetirizine (ZYRTEC) 10 MG tablet Take 10 mg by mouth daily.  . diazepam (VALIUM) 5 MG tablet Take 5 mg by mouth as needed for anxiety.  Marland Kitchen esomeprazole (NEXIUM) 40 MG capsule Take 40 mg by mouth daily.  Marland Kitchen lisinopril (ZESTRIL) 10 MG tablet  Take 10 mg by mouth daily.  . metoprolol succinate (TOPROL-XL) 25 MG 24 hr tablet Take 12.5 mg by mouth daily.  . tadalafil (CIALIS) 5 MG tablet Take 5 mg by mouth daily as needed for erectile dysfunction.  Marland Kitchen VRAYLAR capsule Take 3 mg by mouth daily.     Allergies:   Patient has no known allergies.   Social History   Socioeconomic History  . Marital status: Married    Spouse name: Not on file  . Number of children: 4  . Years of education: Not  on file  . Highest education level: Not on file  Occupational History  . Not on file  Tobacco Use  . Smoking status: Former Smoker    Packs/day: 0.50    Types: Cigarettes    Quit date: 06/15/2012    Years since quitting: 8.8  . Smokeless tobacco: Never Used  Vaping Use  . Vaping Use: Never used  Substance and Sexual Activity  . Alcohol use: Never  . Drug use: Never  . Sexual activity: Not on file  Other Topics Concern  . Not on file  Social History Narrative   Lives at home with wife & 2 sons   Right handed   Social Determinants of Health   Financial Resource Strain: Not on file  Food Insecurity: Not on file  Transportation Needs: Not on file  Physical Activity: Not on file  Stress: Not on file  Social Connections: Not on file     Family History: The patient's family history includes Heart attack in his mother; Heart disease in his mother.  ROS:   Please see the history of present illness.    All other systems reviewed and are negative.  EKGs/Labs/Other Studies Reviewed:    The following studies were reviewed today: I reviewed stress test report done at [place] and discussed with patient at length   Recent Labs: 10/21/2020: ALT 19; BUN 10; Creatinine, Ser 0.87; Hemoglobin 13.7; Platelets 322; Potassium 4.3; Sodium 141; TSH 1.580  Recent Lipid Panel    Component Value Date/Time   CHOL 125 10/21/2020 1152   TRIG 77 10/21/2020 1152   HDL 41 10/21/2020 1152   CHOLHDL 3.0 10/21/2020 1152   CHOLHDL 6.5  CALC 05/30/2007 0918   VLDL 15 05/30/2007 0918   LDLCALC 69 10/21/2020 1152    Physical Exam:    VS:  BP (!) 142/82   Pulse 70   Ht 5\' 9"  (1.753 m)   Wt 189 lb 12.8 oz (86.1 kg)   SpO2 97%   BMI 28.03 kg/m     Wt Readings from Last 3 Encounters:  04/20/21 189 lb 12.8 oz (86.1 kg)  10/21/20 189 lb 3.2 oz (85.8 kg)  04/21/20 187 lb (84.8 kg)     GEN: Patient is in no acute distress HEENT: Normal NECK: No JVD; No carotid bruits LYMPHATICS: No lymphadenopathy CARDIAC: Hear sounds regular, 2/6 systolic murmur at the apex. RESPIRATORY:  Clear to auscultation without rales, wheezing or rhonchi  ABDOMEN: Soft, non-tender, non-distended MUSCULOSKELETAL:  No edema; No deformity  SKIN: Warm and dry NEUROLOGIC:  Alert and oriented x 3 PSYCHIATRIC:  Normal affect   Signed, Jenean Lindau, MD  04/20/2021 1:06 PM    [place]

## 2021-04-20 NOTE — Patient Instructions (Signed)

## 2021-05-04 ENCOUNTER — Other Ambulatory Visit: Payer: Self-pay | Admitting: Cardiology

## 2021-05-05 NOTE — Telephone Encounter (Signed)
Refill sent to pharmacy.   

## 2021-05-11 DIAGNOSIS — M62838 Other muscle spasm: Secondary | ICD-10-CM

## 2021-05-11 DIAGNOSIS — M47816 Spondylosis without myelopathy or radiculopathy, lumbar region: Secondary | ICD-10-CM

## 2021-05-11 DIAGNOSIS — M5136 Other intervertebral disc degeneration, lumbar region: Secondary | ICD-10-CM | POA: Diagnosis not present

## 2021-05-11 HISTORY — DX: Other muscle spasm: M62.838

## 2021-05-11 HISTORY — DX: Spondylosis without myelopathy or radiculopathy, lumbar region: M47.816

## 2021-05-21 DIAGNOSIS — M47816 Spondylosis without myelopathy or radiculopathy, lumbar region: Secondary | ICD-10-CM | POA: Diagnosis not present

## 2021-06-09 ENCOUNTER — Ambulatory Visit: Payer: PPO | Admitting: Psychologist

## 2021-07-08 DIAGNOSIS — L02419 Cutaneous abscess of limb, unspecified: Secondary | ICD-10-CM | POA: Diagnosis not present

## 2021-07-08 DIAGNOSIS — Z79899 Other long term (current) drug therapy: Secondary | ICD-10-CM | POA: Diagnosis not present

## 2021-07-14 DIAGNOSIS — Z9181 History of falling: Secondary | ICD-10-CM | POA: Diagnosis not present

## 2021-07-14 DIAGNOSIS — E785 Hyperlipidemia, unspecified: Secondary | ICD-10-CM | POA: Diagnosis not present

## 2021-07-14 DIAGNOSIS — Z1331 Encounter for screening for depression: Secondary | ICD-10-CM | POA: Diagnosis not present

## 2021-07-14 DIAGNOSIS — Z0000 Encounter for general adult medical examination without abnormal findings: Secondary | ICD-10-CM | POA: Diagnosis not present

## 2021-07-22 DIAGNOSIS — Z79899 Other long term (current) drug therapy: Secondary | ICD-10-CM | POA: Diagnosis not present

## 2021-07-22 DIAGNOSIS — F411 Generalized anxiety disorder: Secondary | ICD-10-CM | POA: Diagnosis not present

## 2021-07-22 DIAGNOSIS — Z6830 Body mass index (BMI) 30.0-30.9, adult: Secondary | ICD-10-CM | POA: Diagnosis not present

## 2021-07-22 DIAGNOSIS — F331 Major depressive disorder, recurrent, moderate: Secondary | ICD-10-CM | POA: Diagnosis not present

## 2021-08-05 DIAGNOSIS — Z79899 Other long term (current) drug therapy: Secondary | ICD-10-CM | POA: Diagnosis not present

## 2021-08-05 DIAGNOSIS — F331 Major depressive disorder, recurrent, moderate: Secondary | ICD-10-CM | POA: Diagnosis not present

## 2021-08-05 DIAGNOSIS — F411 Generalized anxiety disorder: Secondary | ICD-10-CM | POA: Diagnosis not present

## 2021-08-05 DIAGNOSIS — Z6830 Body mass index (BMI) 30.0-30.9, adult: Secondary | ICD-10-CM | POA: Diagnosis not present

## 2021-08-30 DIAGNOSIS — K769 Liver disease, unspecified: Secondary | ICD-10-CM | POA: Diagnosis not present

## 2021-08-30 DIAGNOSIS — R1111 Vomiting without nausea: Secondary | ICD-10-CM | POA: Diagnosis not present

## 2021-08-30 DIAGNOSIS — R1084 Generalized abdominal pain: Secondary | ICD-10-CM | POA: Diagnosis not present

## 2021-08-30 DIAGNOSIS — I700 Atherosclerosis of aorta: Secondary | ICD-10-CM | POA: Diagnosis not present

## 2021-08-30 DIAGNOSIS — R109 Unspecified abdominal pain: Secondary | ICD-10-CM | POA: Diagnosis not present

## 2021-08-30 DIAGNOSIS — R112 Nausea with vomiting, unspecified: Secondary | ICD-10-CM | POA: Diagnosis not present

## 2021-09-09 DIAGNOSIS — R109 Unspecified abdominal pain: Secondary | ICD-10-CM | POA: Diagnosis not present

## 2021-09-09 DIAGNOSIS — B356 Tinea cruris: Secondary | ICD-10-CM | POA: Diagnosis not present

## 2021-09-09 DIAGNOSIS — Z23 Encounter for immunization: Secondary | ICD-10-CM | POA: Diagnosis not present

## 2021-09-09 DIAGNOSIS — Z79899 Other long term (current) drug therapy: Secondary | ICD-10-CM | POA: Diagnosis not present

## 2021-09-30 ENCOUNTER — Other Ambulatory Visit: Payer: Self-pay | Admitting: Cardiology

## 2021-10-02 DIAGNOSIS — F331 Major depressive disorder, recurrent, moderate: Secondary | ICD-10-CM | POA: Diagnosis not present

## 2021-11-03 DIAGNOSIS — F419 Anxiety disorder, unspecified: Secondary | ICD-10-CM | POA: Diagnosis not present

## 2021-11-04 ENCOUNTER — Other Ambulatory Visit: Payer: Self-pay

## 2021-11-04 DIAGNOSIS — I700 Atherosclerosis of aorta: Secondary | ICD-10-CM | POA: Diagnosis not present

## 2021-11-04 DIAGNOSIS — I10 Essential (primary) hypertension: Secondary | ICD-10-CM | POA: Diagnosis not present

## 2021-11-04 DIAGNOSIS — I2510 Atherosclerotic heart disease of native coronary artery without angina pectoris: Secondary | ICD-10-CM | POA: Diagnosis not present

## 2021-11-04 DIAGNOSIS — Z79899 Other long term (current) drug therapy: Secondary | ICD-10-CM | POA: Diagnosis not present

## 2021-11-04 DIAGNOSIS — F411 Generalized anxiety disorder: Secondary | ICD-10-CM | POA: Diagnosis not present

## 2021-11-04 DIAGNOSIS — F331 Major depressive disorder, recurrent, moderate: Secondary | ICD-10-CM | POA: Diagnosis not present

## 2021-11-05 ENCOUNTER — Ambulatory Visit (INDEPENDENT_AMBULATORY_CARE_PROVIDER_SITE_OTHER): Payer: PPO | Admitting: Cardiology

## 2021-11-05 ENCOUNTER — Encounter: Payer: Self-pay | Admitting: Cardiology

## 2021-11-05 ENCOUNTER — Other Ambulatory Visit: Payer: Self-pay

## 2021-11-05 VITALS — BP 142/84 | HR 61 | Ht 68.0 in | Wt 197.0 lb

## 2021-11-05 DIAGNOSIS — I2510 Atherosclerotic heart disease of native coronary artery without angina pectoris: Secondary | ICD-10-CM

## 2021-11-05 DIAGNOSIS — I10 Essential (primary) hypertension: Secondary | ICD-10-CM

## 2021-11-05 DIAGNOSIS — E782 Mixed hyperlipidemia: Secondary | ICD-10-CM | POA: Diagnosis not present

## 2021-11-05 NOTE — Patient Instructions (Signed)
Medication Instructions:  Your physician recommends that you continue on your current medications as directed. Please refer to the Current Medication list given to you today.  *If you need a refill on your cardiac medications before your next appointment, please call your pharmacy*   Lab Work: None ordered If you have labs (blood work) drawn today and your tests are completely normal, you will receive your results only by: [place] (if you have MyChart) OR A paper copy in the mail If you have any lab test that is abnormal or we need to change your treatment, we will call you to review the results.   Testing/Procedures: None ordered   Follow-Up: At [facility], you and your health needs are our priority.  As part of our continuing mission to provide you with exceptional heart care, we have created designated Provider Care Teams.  These Care Teams include your primary Cardiologist (physician) and Advanced Practice Providers (APPs -  Physician Assistants and Nurse Practitioners) who all work together to provide you with the care you need, when you need it.  We recommend signing up for the patient portal called "MyChart".  Sign up information is provided on this After Visit Summary.  MyChart is used to connect with patients for Virtual Visits (Telemedicine).  Patients are able to view lab/test results, encounter notes, upcoming appointments, etc.  Non-urgent messages can be sent to your provider as well.   To learn more about what you can do with MyChart, go to [email or web address].    Your next appointment:   8 month(s)  The format for your next appointment:   In Person  Provider:   Jyl Heinz, MD   Other Instructions NA

## 2021-11-05 NOTE — Progress Notes (Signed)
Cardiology Office Note:    Date:  11/05/2021   ID:  Brandon Wong, DOB 1956/08/10, MRN [id number]  PCP:  Helen Hashimoto., MD  Cardiologist:  Jenean Lindau, MD   Referring MD: Helen Hashimoto., MD    ASSESSMENT:    1. Essential hypertension   2. Coronary artery disease involving native coronary artery of native heart without angina pectoris   3. Mixed dyslipidemia    PLAN:    In order of problems listed above:  Coronary artery disease: Secondary prevention stressed with the patient.  Importance of compliance with diet medication stressed and vocalized understanding.  He has optimal exercise protocol and I congratulated him about this. Essential hypertension: Blood pressure stable and diet was emphasized.  He has an element of whitecoat hypertension. Mixed dyslipidemia: Lipids were reviewed diet was emphasized.  He will be back in the next few days for complete blood work. Patient will be seen in follow-up appointment in 6 months or earlier if the patient has any concerns    Medication Adjustments/Labs and Tests Ordered: Current medicines are reviewed at length with the patient today.  Concerns regarding medicines are outlined above.  Orders Placed This Encounter  Procedures   EKG 12-Lead   No orders of the defined types were placed in this encounter.    No chief complaint on file.    History of Present Illness:    Brandon Wong is a 65 y.o. male.  Patient has past medical history of coronary artery disease, essential hypertension and mixed dyslipidemia.  He denies any problems at this time and takes care of activities of daily living.  No chest pain orthopnea or PND.  At the time of my evaluation, the patient is alert awake oriented and in no distress.  He walks half an hour a day on a daily basis.  At least 4-5 times a week.  Past Medical History:  Diagnosis Date   Anxiety and depression    ANXIETY DEPRESSION 05/10/2008   Qualifier: Diagnosis of  By:  [place], Brandon Wong     ARTHRITIS 04/14/2009   Qualifier: Diagnosis of  By: Plotnikov MD, Evie Lacks    Benign prostatic hyperplasia 04/18/2020   Body mass index (BMI) 27.0-27.9, adult 11/08/2019   BREAST MASS, RIGHT 06/13/2008   Qualifier: Diagnosis of  By: Plotnikov MD, Aleksei V    Cellulitis and abscess of unspecified site 06/13/2008   Qualifier: Diagnosis of  By: Plotnikov MD, Evie Lacks    Chronic bilateral low back pain with right-sided sciatica 11/21/2007   Qualifier: Diagnosis of  By: Plotnikov MD, Evie Lacks    Chronic headache    pt states no longer an issue   COLONIC POLYPS, HX OF 11/21/2007   Qualifier: Diagnosis of  By: Plotnikov MD, Evie Lacks    Coronary artery disease involving native coronary artery of native heart without angina pectoris 06/15/2017   DDD (degenerative disc disease), lumbar 10/09/2019   Degenerative disc disease, cervical    DEGENERATIVE [place] DISEASE, CERVICAL SPINE 05/10/2008   Qualifier: Diagnosis of  By: Danny Lawless CMA, Brandon Wong     DOE (dyspnea on exertion) 10/21/2020   Dyslipidemia    Epididymal cyst 07/06/2019   Essential hypertension 05/10/2008   Qualifier: Diagnosis of  By: [place], Brandon Wong     Ex-smoker 07/12/2018   External hemorrhoids    Family history of acute myocardial infarction    Gastritis    in the past per pt    GASTRITIS 05/10/2008  Qualifier: History of  By: [place], Brandon Wong     GERD 05/10/2008   Qualifier: Diagnosis of  By: [place], Brandon Wong     GERD (gastroesophageal reflux disease)    in the past per pt    HEADACHE, CHRONIC, HX OF 05/10/2008   Qualifier: Diagnosis of  By: [place], Brandon Wong     HEMORRHOIDS, EXTERNAL 05/10/2008   Qualifier: History of  By: [place], Brandon Wong     Hemorrhoids, internal, with bleeding 02/21/2020   History of colonic polyps    Hypertension    Impingement syndrome of right shoulder    KNEE PAIN, LEFT 09/12/2009   Qualifier: Diagnosis of  By: Jenny Reichmann MD, Hunt Oris    Lateral epicondylitis  of elbow    Low back  pain    Lumbar radiculopathy 10/09/2019   Lumbosacral radiculopathy at L5 08/28/2019   Meralgia paresthetica    METHICILLIN RESISTANT STAPHYLOCOCCUS AUREUS INFECTION 06/13/2008   Qualifier: Diagnosis of  By: Alain Marion MD, Evie Lacks    Mixed dyslipidemia 05/10/2008   Qualifier: Diagnosis of  By: Danny Lawless CMA, Brandon Wong     MRSA (methicillin resistant Staphylococcus aureus) 2009   Skin   Osteoarthritis    OSTEOARTHRITIS 11/21/2007   Qualifier: Diagnosis of  By: Alain Marion MD, Evie Lacks    Plantar fasciitis 08/04/2017   Psoriasis    Possible psoriatic arthritis 2010   Renal calculus    RENAL CALCULUS, HX OF 05/10/2008   Qualifier: Diagnosis of  By: [place], Brandon Wong     Screening for malignant neoplasm of colon 03/26/2021   SHOULDER IMPINGEMENT SYNDROME, RIGHT 05/10/2008   Qualifier: History of  By: [place], Brandon Wong     SKIN RASH 04/14/2009   Qualifier: Diagnosis of  By: Alain Marion MD, Evie Lacks    Sleep disorder    pt states he doesn't have this anymore   SLEEP DISORDER, HX OF 05/10/2008   Qualifier: Diagnosis of  By: [place], Brandon Wong     Tubular adenoma of colon 02/25/2016    Past Surgical History:  Procedure Laterality Date   COLONOSCOPY     CORONARY ANGIOPLASTY WITH STENT PLACEMENT  2013   3 stents   FINGER SURGERY     POLYPECTOMY     ROTATOR CUFF REPAIR     right    Current Medications: Current Meds  Medication Sig   aspirin EC 81 MG tablet Take 81 mg by mouth daily.   atorvastatin (LIPITOR) 20 MG tablet Take 20 mg by mouth 2 (two) times daily.   diazepam (VALIUM) 5 MG tablet Take 5 mg by mouth as needed for anxiety.   esomeprazole (NEXIUM) 40 MG capsule Take 40 mg by mouth daily.   lisinopril (ZESTRIL) 20 MG tablet Take 20 mg by mouth daily.   metoprolol succinate (TOPROL-XL) 25 MG 24 hr tablet TAKE 1/2 TABLET BY MOUTH DAILY   tadalafil (CIALIS) 5 MG tablet Take 5 mg by mouth daily as needed for erectile dysfunction.     Allergies:   Patient has no known allergies.    Social History   Socioeconomic History   Marital status: Married    Spouse name: Not on file   Number of children: 4   Years of education: Not on file   Highest education level: Not on file  Occupational History   Not on file  Tobacco Use   Smoking status: Former    Packs/day: 0.50    Types: Cigarettes    Quit date: 06/15/2012  Years since quitting: 9.3   Smokeless tobacco: Never  Vaping Use   Vaping Use: Never used  Substance and Sexual Activity   Alcohol use: Never   Drug use: Never   Sexual activity: Not on file  Other Topics Concern   Not on file  Social History Narrative   Lives at home with wife & 2 sons   Right handed   Social Determinants of Health   Financial Resource Strain: Not on file  Food Insecurity: Not on file  Transportation Needs: Not on file  Physical Activity: Not on file  Stress: Not on file  Social Connections: Not on file     Family History: The patient's family history includes Heart attack in his mother; Heart disease in his mother.  ROS:   Please see the history of present illness.    All other systems reviewed and are negative.  EKGs/Labs/Other Studies Reviewed:    The following studies were reviewed today: I discussed my findings with the patient at length.  EKG reveals sinus rhythm and nonspecific ST-T changes   Recent Labs: No results found for requested labs within last 8760 hours.  Recent Lipid Panel    Component Value Date/Time   CHOL 125 10/21/2020 1152   TRIG 77 10/21/2020 1152   HDL 41 10/21/2020 1152   CHOLHDL 3.0 10/21/2020 1152   CHOLHDL 6.5 CALC 05/30/2007 0918   VLDL 15 05/30/2007 0918   LDLCALC 69 10/21/2020 1152    Physical Exam:    VS:  BP (!) 142/84   Pulse 61   Ht 5\' 8"  (1.727 m)   Wt 197 lb (89.4 kg)   SpO2 96%   BMI 29.95 kg/m     Wt Readings from Last 3 Encounters:  11/05/21 197 lb (89.4 kg)  04/20/21 189 lb 12.8 oz (86.1 kg)  10/21/20 189 lb 3.2 oz (85.8 kg)     GEN: Patient is in  no acute distress HEENT: Normal NECK: No JVD; No carotid bruits LYMPHATICS: No lymphadenopathy CARDIAC: Hear sounds regular, 2/6 systolic murmur at the apex. RESPIRATORY:  Clear to auscultation without rales, wheezing or rhonchi  ABDOMEN: Soft, non-tender, non-distended MUSCULOSKELETAL:  No edema; No deformity  SKIN: Warm and dry NEUROLOGIC:  Alert and oriented x 3 PSYCHIATRIC:  Normal affect   Signed, Jenean Lindau, MD  11/05/2021 3:15 PM    [facility]

## 2021-12-02 ENCOUNTER — Telehealth: Payer: Self-pay | Admitting: Cardiology

## 2021-12-02 ENCOUNTER — Other Ambulatory Visit: Payer: Self-pay

## 2021-12-02 MED ORDER — ATORVASTATIN CALCIUM 40 MG PO TABS: 40.0000 mg | ORAL_TABLET | Freq: Every day | ORAL | 3 refills | Status: DC

## 2021-12-02 MED ORDER — ATORVASTATIN CALCIUM 20 MG PO TABS: 20.0000 mg | ORAL_TABLET | Freq: Two times a day (BID) | ORAL | 3 refills | Status: DC

## 2021-12-02 NOTE — Telephone Encounter (Signed)
°*  STAT* If patient is at the pharmacy, call can be transferred to refill team.   1. Which medications need to be refilled? (please list name of each medication and dose if known) atorvastatin (LIPITOR) 40 MG tablet  2. Which pharmacy/location (including street and city if local pharmacy) is medication to be sent to? [place], [place]  3. Do they need a 30 day or 90 day supply? 90 day

## 2021-12-02 NOTE — Telephone Encounter (Signed)
Pt c/o medication issue:  1. Name of Medication: atorvastatin (LIPITOR) 20 MG tablet  2. How are you currently taking this medication (dosage and times per day)? Patient currently taking 2x daily, once in the AM and once in the PM  3. Are you having a reaction (difficulty breathing--STAT)?   4. What is your medication issue? Patient wanted clarification about the dosage of his medication. The patient would like to just take one tablet daily if the dosage needs to be increased. Please advise

## 2021-12-08 DIAGNOSIS — B356 Tinea cruris: Secondary | ICD-10-CM | POA: Diagnosis not present

## 2021-12-15 DIAGNOSIS — Z79899 Other long term (current) drug therapy: Secondary | ICD-10-CM | POA: Diagnosis not present

## 2021-12-15 DIAGNOSIS — L219 Seborrheic dermatitis, unspecified: Secondary | ICD-10-CM | POA: Diagnosis not present

## 2021-12-15 DIAGNOSIS — L821 Other seborrheic keratosis: Secondary | ICD-10-CM | POA: Diagnosis not present

## 2021-12-15 DIAGNOSIS — L304 Erythema intertrigo: Secondary | ICD-10-CM | POA: Diagnosis not present

## 2021-12-15 DIAGNOSIS — M5414 Radiculopathy, thoracic region: Secondary | ICD-10-CM | POA: Diagnosis not present

## 2021-12-22 DIAGNOSIS — N138 Other obstructive and reflux uropathy: Secondary | ICD-10-CM | POA: Diagnosis not present

## 2021-12-22 DIAGNOSIS — N401 Enlarged prostate with lower urinary tract symptoms: Secondary | ICD-10-CM | POA: Diagnosis not present

## 2021-12-23 DIAGNOSIS — F331 Major depressive disorder, recurrent, moderate: Secondary | ICD-10-CM | POA: Diagnosis not present

## 2021-12-23 DIAGNOSIS — Z79899 Other long term (current) drug therapy: Secondary | ICD-10-CM | POA: Diagnosis not present

## 2021-12-23 DIAGNOSIS — I10 Essential (primary) hypertension: Secondary | ICD-10-CM | POA: Diagnosis not present

## 2021-12-23 DIAGNOSIS — Z6831 Body mass index (BMI) 31.0-31.9, adult: Secondary | ICD-10-CM | POA: Diagnosis not present

## 2021-12-24 ENCOUNTER — Encounter: Payer: Self-pay | Admitting: Cardiology

## 2021-12-24 ENCOUNTER — Ambulatory Visit (INDEPENDENT_AMBULATORY_CARE_PROVIDER_SITE_OTHER): Payer: PPO | Admitting: Cardiology

## 2021-12-24 ENCOUNTER — Telehealth: Payer: Self-pay | Admitting: Cardiology

## 2021-12-24 ENCOUNTER — Other Ambulatory Visit: Payer: Self-pay

## 2021-12-24 VITALS — BP 158/110 | HR 62 | Ht 69.0 in | Wt 191.6 lb

## 2021-12-24 DIAGNOSIS — I10 Essential (primary) hypertension: Secondary | ICD-10-CM | POA: Diagnosis not present

## 2021-12-24 DIAGNOSIS — I2510 Atherosclerotic heart disease of native coronary artery without angina pectoris: Secondary | ICD-10-CM | POA: Diagnosis not present

## 2021-12-24 DIAGNOSIS — E785 Hyperlipidemia, unspecified: Secondary | ICD-10-CM | POA: Diagnosis not present

## 2021-12-24 MED ORDER — LISINOPRIL 20 MG PO TABS: 20.0000 mg | ORAL_TABLET | Freq: Every day | ORAL | 3 refills | Status: DC

## 2021-12-24 NOTE — Telephone Encounter (Signed)
Pt c/o BP issue: STAT if pt c/o blurred vision, one-sided weakness or slurred speech  1. What are your last 5 BP readings?  172/106 HR 88  2. Are you having any other symptoms (ex. Dizziness, headache, blurred vision, passed out)? Feels a little shaky, has a headache  3. What is your BP issue? Elevated BP.

## 2021-12-24 NOTE — Progress Notes (Signed)
Cardiology Office Note:    Date:  12/24/2021   ID:  Brandon Wong, DOB 09-07-1956, MRN [id number]  PCP:  Helen Hashimoto., MD  Cardiologist:  Jenean Lindau, MD   Referring MD: Helen Hashimoto., MD    ASSESSMENT:    1. Coronary artery disease involving native coronary artery of native heart without angina pectoris   2. Essential hypertension   3. Dyslipidemia    PLAN:    In order of problems listed above:  Coronary artery disease: Secondary prevention stressed with the patient.  Importance of compliance with diet medication stressed any vocalized understanding. Essential hypertension: Elevated.  He appears anxious.  I told him to take an extra 10 mg of lisinopril tonight and begin taking 20 mg beginning tomorrow morning.  He generally takes only 10 mg in the morning.  After he takes his blood pressure medications and 2 hours later he will check his blood pressure and pulse and let us know in the morning.  At the time of my evaluation he is comfortable and cheerful.  He is very relaxed now to note that his blood pressure is not such a big issue and that we can handle it.  Diet was emphasized.  Lifestyle modification and relaxation stressed any vocalized understanding.  Once his blood pressure is fine I told him to walk on a regular basis and he promises to do so. Mixed dyslipidemia: Lipids were reviewed and diet was emphasized and questions were answered to his satisfaction. He will keep his follow-up appointment from previous.  If he has any questions he will call us.   Medication Adjustments/Labs and Tests Ordered: Current medicines are reviewed at length with the patient today.  Concerns regarding medicines are outlined above.  No orders of the defined types were placed in this encounter.  No orders of the defined types were placed in this encounter.    No chief complaint on file.    History of Present Illness:    Brandon Wong is a 66 y.o. male.  Patient  has past medical history of coronary artery disease, essential hypertension and dyslipidemia.  He mentions to me that he came in to be seen today because his blood pressure is elevated.  He tried some medicine by his psychiatrist and he was told that the blood pressure may go up and he did so he stopped that medication.  He denies any chest pain orthopnea or PND.  He has had headaches on and off for a very long time currently has no headache.  At the time of my evaluation, the patient is alert awake oriented and in no distress.  He appears anxious.  Past Medical History:  Diagnosis Date   Anxiety and depression    ANXIETY DEPRESSION 05/10/2008   Qualifier: Diagnosis of  By: [place], [place]     ARTHRITIS 04/14/2009   Qualifier: Diagnosis of  By: Plotnikov MD, Evie Lacks    Benign prostatic hyperplasia 04/18/2020   Body mass index (BMI) 27.0-27.9, adult 11/08/2019   BREAST MASS, RIGHT 06/13/2008   Qualifier: Diagnosis of  By: Plotnikov MD, Aleksei V    Cellulitis and abscess of unspecified site 06/13/2008   Qualifier: Diagnosis of  By: Plotnikov MD, Evie Lacks    Chronic bilateral low back pain with right-sided sciatica 11/21/2007   Qualifier: Diagnosis of  By: Plotnikov MD, Evie Lacks    Chronic headache    pt states no longer an issue   COLONIC POLYPS, HX OF  11/21/2007   Qualifier: Diagnosis of  By: Plotnikov MD, Evie Lacks    Coronary artery disease involving native coronary artery of native heart without angina pectoris 06/15/2017   DDD (degenerative disc disease), lumbar 10/09/2019   Degenerative disc disease, cervical    DEGENERATIVE [place] DISEASE, CERVICAL SPINE 05/10/2008   Qualifier: Diagnosis of  By: [place], [place]     DOE (dyspnea on exertion) 10/21/2020   Dyslipidemia    Epididymal cyst 07/06/2019   Essential hypertension 05/10/2008   Qualifier: Diagnosis of  By: [place], [place]     Ex-smoker 07/12/2018   External hemorrhoids    Family history of acute myocardial infarction     Gastritis    in the past per pt    GASTRITIS 05/10/2008   Qualifier: History of  By: [place], [place]     GERD 05/10/2008   Qualifier: Diagnosis of  By: Danny Lawless CMA, [place]     GERD (gastroesophageal reflux disease)    in the past per pt    HEADACHE, CHRONIC, HX OF 05/10/2008   Qualifier: Diagnosis of  By: [place], [place]     HEMORRHOIDS, EXTERNAL 05/10/2008   Qualifier: History of  By: [place], [place]     Hemorrhoids, internal, with bleeding 02/21/2020   History of colonic polyps    Hypertension    Impingement syndrome of right shoulder    KNEE PAIN, LEFT 09/12/2009   Qualifier: Diagnosis of  By: Jenny Reichmann MD, Hunt Oris    Lateral epicondylitis  of elbow    Low back pain    Lumbar radiculopathy 10/09/2019   Lumbosacral radiculopathy at L5 08/28/2019   Meralgia paresthetica    METHICILLIN RESISTANT STAPHYLOCOCCUS AUREUS INFECTION 06/13/2008   Qualifier: Diagnosis of  By: Alain Marion MD, Evie Lacks    Mixed dyslipidemia 05/10/2008   Qualifier: Diagnosis of  By: Danny Lawless CMA, [place]     MRSA (methicillin resistant Staphylococcus aureus) 2009   Skin   Osteoarthritis    OSTEOARTHRITIS 11/21/2007   Qualifier: Diagnosis of  By: Alain Marion MD, Evie Lacks    Plantar fasciitis 08/04/2017   Psoriasis    Possible psoriatic arthritis 2010   Renal calculus    RENAL CALCULUS, HX OF 05/10/2008   Qualifier: Diagnosis of  By: [place], [place]     Screening for malignant neoplasm of colon 03/26/2021   SHOULDER IMPINGEMENT SYNDROME, RIGHT 05/10/2008   Qualifier: History of  By: [place], [place]     SKIN RASH 04/14/2009   Qualifier: Diagnosis of  By: Alain Marion MD, Evie Lacks    Sleep disorder    pt states he doesn't have this anymore   SLEEP DISORDER, HX OF 05/10/2008   Qualifier: Diagnosis of  By: [place], [place]     Tubular adenoma of colon 02/25/2016    Past Surgical History:  Procedure Laterality Date   COLONOSCOPY     CORONARY ANGIOPLASTY WITH STENT PLACEMENT  2013   3 stents   FINGER SURGERY      POLYPECTOMY     ROTATOR CUFF REPAIR     right    Current Medications: Current Meds  Medication Sig   aspirin EC 81 MG tablet Take 81 mg by mouth daily.   atorvastatin (LIPITOR) 40 MG tablet Take 1 tablet (40 mg total) by mouth daily.   diazepam (VALIUM) 5 MG tablet Take 5 mg by mouth as needed for anxiety.   esomeprazole (NEXIUM) 40 MG capsule Take 40 mg by mouth daily.   fluconazole (DIFLUCAN)  200 MG tablet Take 200 mg by mouth 2 (two) times a week.   FLUoxetine (PROZAC) 20 MG capsule Take 20 mg by mouth daily.   lisinopril (ZESTRIL) 10 MG tablet Take 20 mg by mouth daily.   metoprolol succinate (TOPROL-XL) 25 MG 24 hr tablet TAKE 1/2 TABLET BY MOUTH DAILY   tadalafil (CIALIS) 5 MG tablet Take 5 mg by mouth daily as needed for erectile dysfunction.     Allergies:   Patient has no known allergies.   Social History   Socioeconomic History   Marital status: Married    Spouse name: Not on file   Number of children: 4   Years of education: Not on file   Highest education level: Not on file  Occupational History   Not on file  Tobacco Use   Smoking status: Former    Packs/day: 0.50    Types: Cigarettes    Quit date: 06/15/2012    Years since quitting: 9.5   Smokeless tobacco: Never  Vaping Use   Vaping Use: Never used  Substance and Sexual Activity   Alcohol use: Never   Drug use: Never   Sexual activity: Not on file  Other Topics Concern   Not on file  Social History Narrative   Lives at home with wife & 2 sons   Right handed   Social Determinants of Health   Financial Resource Strain: Not on file  Food Insecurity: Not on file  Transportation Needs: Not on file  Physical Activity: Not on file  Stress: Not on file  Social Connections: Not on file     Family History: The patient's family history includes Heart attack in his mother; Heart disease in his mother.  ROS:   Please see the history of present illness.    All other systems reviewed and are  negative.  EKGs/Labs/Other Studies Reviewed:    The following studies were reviewed today: I discussed my findings with the patient at length.   Recent Labs: No results found for requested labs within last 8760 hours.  Recent Lipid Panel    Component Value Date/Time   CHOL 125 10/21/2020 1152   TRIG 77 10/21/2020 1152   HDL 41 10/21/2020 1152   CHOLHDL 3.0 10/21/2020 1152   CHOLHDL 6.5 CALC 05/30/2007 0918   VLDL 15 05/30/2007 0918   LDLCALC 69 10/21/2020 1152    Physical Exam:    VS:  BP (!) 158/110    Pulse 62    Ht 5\' 9"  (1.753 m)    Wt 191 lb 9.6 oz (86.9 kg)    SpO2 96%    BMI 28.29 kg/m     Wt Readings from Last 3 Encounters:  12/24/21 191 lb 9.6 oz (86.9 kg)  11/05/21 197 lb (89.4 kg)  04/20/21 189 lb 12.8 oz (86.1 kg)     GEN: Patient is in no acute distress HEENT: Normal NECK: No JVD; No carotid bruits LYMPHATICS: No lymphadenopathy CARDIAC: Hear sounds regular, 2/6 systolic murmur at the apex. RESPIRATORY:  Clear to auscultation without rales, wheezing or rhonchi  ABDOMEN: Soft, non-tender, non-distended MUSCULOSKELETAL:  No edema; No deformity  SKIN: Warm and dry NEUROLOGIC:  Alert and oriented x 3 PSYCHIATRIC:  Normal affect   Signed, Jenean Lindau, MD  12/24/2021 3:54 PM    [place]

## 2021-12-24 NOTE — Telephone Encounter (Signed)
Left message on patients voicemail to please return our call.   

## 2021-12-24 NOTE — Telephone Encounter (Signed)
Spoke to the patient just now and he let me know that he took his medications at 6 am this morning. He went to his PCP yesterday for this and they increased his lisinopril to 20 mg daily. He states before he was only taking 10 mg daily (This is not what we have in the chart). He took his blood pressure at 9 am and states it was 172/106 and heart rate was 88 bpm. He states that he feels a little shaky and has a small headache at this time.   I advised him to sit down and rest/drink fluids as he is clearly very anxious on the phone. I also advised that since he just increased this lisinopril this morning per his PCP he needs to give it some time to fully see the effectiveness. I advised him to take his blood pressure daily about an hour after taking his medications in the morning and to keep a record of these readings. He will call us in one week to let us know what these readings are.   I will route to Dr. Geraldo Pitter to see if he has any other recommendations at this time.

## 2021-12-24 NOTE — Telephone Encounter (Signed)
Transferred call to [place] °

## 2021-12-24 NOTE — Patient Instructions (Signed)
Medication Instructions:  Your physician has recommended you make the following change in your medication:   Increase Lisinopril 20 mg daily.   *If you need a refill on your cardiac medications before your next appointment, please call your pharmacy*   Lab Work: None ordered If you have labs (blood work) drawn today and your tests are completely normal, you will receive your results only by: [place] (if you have MyChart) OR A paper copy in the mail If you have any lab test that is abnormal or we need to change your treatment, we will call you to review the results.   Testing/Procedures: None ordered   Follow-Up: At [facility], you and your health needs are our priority.  As part of our continuing mission to provide you with exceptional heart care, we have created designated Provider Care Teams.  These Care Teams include your primary Cardiologist (physician) and Advanced Practice Providers (APPs -  Physician Assistants and Nurse Practitioners) who all work together to provide you with the care you need, when you need it.  We recommend signing up for the patient portal called "MyChart".  Sign up information is provided on this After Visit Summary.  MyChart is used to connect with patients for Virtual Visits (Telemedicine).  Patients are able to view lab/test results, encounter notes, upcoming appointments, etc.  Non-urgent messages can be sent to your provider as well.   To learn more about what you can do with MyChart, go to [email or web address].    Your next appointment:   As scheduled  The format for your next appointment:   In Person  Provider:   Jyl Heinz, MD   Other Instructions NA

## 2021-12-24 NOTE — Telephone Encounter (Signed)
Spoke to the patient just now and let him know Dr. Julien Nordmann recommendations. He would like to be seen today. I scheduled him for today at 3:40 with Dr. Geraldo Pitter.   Encouraged patient to call back with any questions or concerns.

## 2021-12-25 MED ORDER — LISINOPRIL 40 MG PO TABS
40.0000 mg | ORAL_TABLET | Freq: Every day | ORAL | 3 refills | Status: DC
Start: 2021-12-25 — End: 2022-11-24

## 2021-12-25 NOTE — Telephone Encounter (Signed)
Pt c/o BP issue: STAT if pt c/o blurred vision, one-sided weakness or slurred speech  1. What are your last 5 BP readings?   01/20: 160/108 74  166/111 71 (around 10:30a)  2. Are you having any other symptoms (ex. Dizziness, headache, blurred vision, passed out)?  No symptoms  3. What is your BP issue?   Patient is following up. He states his BP is still elevated today, but he is asymptomatic. He states he took for readings a few hours after taking his medication.

## 2021-12-25 NOTE — Telephone Encounter (Signed)
Spoke to the patient just now and let him know Dr. Julien Nordmann recommendations. He verbalizes understanding and thanks me for the call back.

## 2021-12-25 NOTE — Addendum Note (Signed)
Addended by: Resa Miner I on: 12/25/2021 11:28 AM   Modules accepted: Orders

## 2021-12-26 DIAGNOSIS — E7800 Pure hypercholesterolemia, unspecified: Secondary | ICD-10-CM | POA: Diagnosis not present

## 2021-12-26 DIAGNOSIS — I2510 Atherosclerotic heart disease of native coronary artery without angina pectoris: Secondary | ICD-10-CM | POA: Diagnosis not present

## 2021-12-26 DIAGNOSIS — Z87891 Personal history of nicotine dependence: Secondary | ICD-10-CM | POA: Diagnosis not present

## 2021-12-26 DIAGNOSIS — Z7982 Long term (current) use of aspirin: Secondary | ICD-10-CM | POA: Diagnosis not present

## 2021-12-26 DIAGNOSIS — Z885 Allergy status to narcotic agent status: Secondary | ICD-10-CM | POA: Diagnosis not present

## 2021-12-26 DIAGNOSIS — I10 Essential (primary) hypertension: Secondary | ICD-10-CM | POA: Diagnosis not present

## 2021-12-28 NOTE — Telephone Encounter (Signed)
Patient calling back.   °

## 2021-12-28 NOTE — Telephone Encounter (Signed)
Spoke to the patient just now and he let me know that his blood pressure got so high Saturday that he went to the ED. He states that at the hospital his BP was 180/125. At the hospital he states he got some x-rays done and everything came back normal. They also started him on amlodipine 10 mg daily. Today his blood pressure was 137/94 heart rate 80 bpm.   He just wanted to make Dr. Geraldo Pitter aware of this. I will route to him at this time.

## 2021-12-29 DIAGNOSIS — I10 Essential (primary) hypertension: Secondary | ICD-10-CM | POA: Diagnosis not present

## 2021-12-29 DIAGNOSIS — Z6830 Body mass index (BMI) 30.0-30.9, adult: Secondary | ICD-10-CM | POA: Diagnosis not present

## 2021-12-29 DIAGNOSIS — J329 Chronic sinusitis, unspecified: Secondary | ICD-10-CM | POA: Diagnosis not present

## 2021-12-29 DIAGNOSIS — T7840XA Allergy, unspecified, initial encounter: Secondary | ICD-10-CM | POA: Diagnosis not present

## 2021-12-30 DIAGNOSIS — Z87442 Personal history of urinary calculi: Secondary | ICD-10-CM | POA: Diagnosis not present

## 2021-12-30 DIAGNOSIS — N401 Enlarged prostate with lower urinary tract symptoms: Secondary | ICD-10-CM | POA: Diagnosis not present

## 2021-12-30 DIAGNOSIS — N138 Other obstructive and reflux uropathy: Secondary | ICD-10-CM | POA: Diagnosis not present

## 2021-12-30 DIAGNOSIS — R972 Elevated prostate specific antigen [PSA]: Secondary | ICD-10-CM | POA: Diagnosis not present

## 2021-12-30 DIAGNOSIS — N451 Epididymitis: Secondary | ICD-10-CM | POA: Diagnosis not present

## 2022-01-01 DIAGNOSIS — N281 Cyst of kidney, acquired: Secondary | ICD-10-CM | POA: Diagnosis not present

## 2022-01-13 DIAGNOSIS — R972 Elevated prostate specific antigen [PSA]: Secondary | ICD-10-CM | POA: Diagnosis not present

## 2022-01-14 DIAGNOSIS — R1084 Generalized abdominal pain: Secondary | ICD-10-CM

## 2022-01-14 HISTORY — DX: Generalized abdominal pain: R10.84

## 2022-01-21 DIAGNOSIS — Z20822 Contact with and (suspected) exposure to covid-19: Secondary | ICD-10-CM | POA: Diagnosis not present

## 2022-01-21 DIAGNOSIS — U071 COVID-19: Secondary | ICD-10-CM | POA: Diagnosis not present

## 2022-02-16 DIAGNOSIS — M5414 Radiculopathy, thoracic region: Secondary | ICD-10-CM | POA: Diagnosis not present

## 2022-02-16 DIAGNOSIS — Z6831 Body mass index (BMI) 31.0-31.9, adult: Secondary | ICD-10-CM | POA: Diagnosis not present

## 2022-02-16 DIAGNOSIS — K648 Other hemorrhoids: Secondary | ICD-10-CM | POA: Diagnosis not present

## 2022-02-18 DIAGNOSIS — M1611 Unilateral primary osteoarthritis, right hip: Secondary | ICD-10-CM | POA: Diagnosis not present

## 2022-02-18 DIAGNOSIS — M545 Low back pain, unspecified: Secondary | ICD-10-CM | POA: Diagnosis not present

## 2022-03-15 DIAGNOSIS — M5451 Vertebrogenic low back pain: Secondary | ICD-10-CM | POA: Diagnosis not present

## 2022-03-18 ENCOUNTER — Ambulatory Visit (INDEPENDENT_AMBULATORY_CARE_PROVIDER_SITE_OTHER): Payer: PPO | Admitting: Cardiology

## 2022-03-18 ENCOUNTER — Encounter: Payer: Self-pay | Admitting: Cardiology

## 2022-03-18 VITALS — BP 116/68 | HR 68 | Ht 68.0 in | Wt 181.1 lb

## 2022-03-18 DIAGNOSIS — I2510 Atherosclerotic heart disease of native coronary artery without angina pectoris: Secondary | ICD-10-CM | POA: Diagnosis not present

## 2022-03-18 DIAGNOSIS — E782 Mixed hyperlipidemia: Secondary | ICD-10-CM | POA: Diagnosis not present

## 2022-03-18 DIAGNOSIS — I10 Essential (primary) hypertension: Secondary | ICD-10-CM | POA: Diagnosis not present

## 2022-03-18 NOTE — Patient Instructions (Addendum)
Medication Instructions:  ?Your physician recommends that you continue on your current medications as directed. Please refer to the Current Medication list given to you today.  ?*If you need a refill on your cardiac medications before your next appointment, please call your pharmacy* ? ? ?Lab Work: ?Your physician recommends that you return for lab work in: May 2023. [facility] is located on the 2nd floor, [address]. ? ?You need to have labs done when you are fasting.  You can come Monday through Thursday 8:30 am to 12:00 pm and 1:15 to 4:30 or Friday 8:00 am to 12:00 pm. You do not need to make an appointment as the order has already been placed. The labs you are going to have done are BMET, CBC, TSH, LFT and Lipids. ? ?If you have labs (blood work) drawn today and your tests are completely normal, you will receive your results only by: ?MyChart Message (if you have MyChart) OR ?A paper copy in the mail ?If you have any lab test that is abnormal or we need to change your treatment, we will call you to review the results. ? ? ?Testing/Procedures: ?None ordered ? ? ?Follow-Up: ?At [facility], you and your health needs are our priority.  As part of our continuing mission to provide you with exceptional heart care, we have created designated Provider Care Teams.  These Care Teams include your primary Cardiologist (physician) and Advanced Practice Providers (APPs -  Physician Assistants and Nurse Practitioners) who all work together to provide you with the care you need, when you need it. ? ?We recommend signing up for the patient portal called "MyChart".  Sign up information is provided on this After Visit Summary.  MyChart is used to connect with patients for Virtual Visits (Telemedicine).  Patients are able to view lab/test results, encounter notes, upcoming appointments, etc.  Non-urgent messages can be sent to your provider as well.   ?To learn more about what you can do with MyChart, go to  [email or web address].   ? ?Your next appointment:   ?9 month(s) ? ?The format for your next appointment:   ?In Person ? ?Provider:   ?Jyl Heinz, MD ? ? ?Other Instructions ?NA ? ?

## 2022-03-18 NOTE — Progress Notes (Signed)
?Cardiology Office Note:   ? ?Date:  03/18/2022  ? ?ID:  Brandon Wong, DOB Apr 28, 1956, MRN [id number] ? ?PCP:  Helen Hashimoto., MD  ?Cardiologist:  Jenean Lindau, MD  ? ?Referring MD: Helen Hashimoto., MD  ? ? ?ASSESSMENT:   ? ?1. Coronary artery disease involving native coronary artery of native heart without angina pectoris   ?2. Essential hypertension   ?3. Mixed dyslipidemia   ? ?PLAN:   ? ?In order of problems listed above: ? ?Coronary artery disease: Secondary prevention stressed with the patient.  Importance of compliance with diet and medication stressed any vocalized understanding.  He is doing very well with exercise and is asymptomatic with this.  He promises to continue this. ?Essential hypertension: Blood pressure stable and diet was emphasized.  Lifestyle modification urged. ?Mixed dyslipidemia: Lipids were reviewed and they are fine.  Diet emphasized. ?Aortoiliac atherosclerosis: Medical management at this time.  Asymptomatic. ?Patient will be seen in follow-up appointment in 9 months or earlier if the patient has any concerns ? ? ? ?Medication Adjustments/Labs and Tests Ordered: ?Current medicines are reviewed at length with the patient today.  Concerns regarding medicines are outlined above.  ?No orders of the defined types were placed in this encounter. ? ?No orders of the defined types were placed in this encounter. ? ? ? ?No chief complaint on file. ?  ? ?History of Present Illness:   ? ?Brandon Wong is a 66 y.o. male.  Patient has past medical history of coronary artery disease post stenting, essential hypertension, mixed dyslipidemia.  He denies any problems at this time and takes care of activities of daily living.  No chest pain orthopnea or PND.  He has aortoiliac atherosclerosis also.  He was concerned about it and therefore came for evaluation.  He exercises on a regular basis without any symptoms.  At the time of my evaluation, the patient is alert awake oriented and  in no distress. ? ?Past Medical History:  ?Diagnosis Date  ? Anxiety and depression   ? ANXIETY DEPRESSION 05/10/2008  ? Qualifier: Diagnosis of  By: [place], [place]    ? ARTHRITIS 04/14/2009  ? Qualifier: Diagnosis of  By: Plotnikov MD, Evie Lacks   ? Benign prostatic hyperplasia 04/18/2020  ? Body mass index (BMI) 27.0-27.9, adult 11/08/2019  ? BREAST MASS, RIGHT 06/13/2008  ? Qualifier: Diagnosis of  By: Plotnikov MD, Evie Lacks   ? Cellulitis and abscess of unspecified site 06/13/2008  ? Qualifier: Diagnosis of  By: Plotnikov MD, Evie Lacks   ? Chronic bilateral low back pain with right-sided sciatica 11/21/2007  ? Qualifier: Diagnosis of  By: Plotnikov MD, Evie Lacks   ? Chronic headache   ? pt states no longer an issue  ? COLONIC POLYPS, HX OF 11/21/2007  ? Qualifier: Diagnosis of  By: Plotnikov MD, Evie Lacks   ? Coronary artery disease involving native coronary artery of native heart without angina pectoris 06/15/2017  ? DDD (degenerative disc disease), lumbar 10/09/2019  ? Degenerative disc disease, cervical   ? DEGENERATIVE DISC DISEASE, CERVICAL SPINE 05/10/2008  ? Qualifier: Diagnosis of  By: [place], [place]    ? Diffuse abdominal pain 01/14/2022  ? DOE (dyspnea on exertion) 10/21/2020  ? Dyslipidemia   ? Epididymal cyst 07/06/2019  ? Essential hypertension 05/10/2008  ? Qualifier: Diagnosis of  By: [place], [place]    ? Ex-smoker 07/12/2018  ? External hemorrhoids   ? Family history of acute myocardial infarction   ?  Gastritis   ? in the past per pt   ? GASTRITIS 05/10/2008  ? Qualifier: History of  By: [place], [place]    ? GERD 05/10/2008  ? Qualifier: Diagnosis of  By: [place], [place]    ? GERD (gastroesophageal reflux disease)   ? in the past per pt   ? HEADACHE, CHRONIC, HX OF 05/10/2008  ? Qualifier: Diagnosis of  By: [place], [place]    ? HEMORRHOIDS, EXTERNAL 05/10/2008  ? Qualifier: History of  By: [place], [place]    ? Hemorrhoids, internal, with bleeding 02/21/2020  ? History of colonic polyps   ?  Hypertension   ? Impingement syndrome of right shoulder   ? KNEE PAIN, LEFT 09/12/2009  ? Qualifier: Diagnosis of  By: Jenny Reichmann MD, [place] Oris   ? Lateral epicondylitis  of elbow   ? Low back pain   ? Lumbar radiculopathy 10/09/2019  ? Lumbosacral radiculopathy at L5 08/28/2019  ? Meralgia paresthetica   ? [place] INFECTION 06/13/2008  ? Qualifier: Diagnosis of  By: Plotnikov MD, Evie Lacks   ? Mixed dyslipidemia 05/10/2008  ? Qualifier: Diagnosis of  By: [place], [place]    ? MRSA (methicillin resistant Staphylococcus aureus) 2009  ? Skin  ? Muscle spasm 05/11/2021  ? Osteoarthritis   ? OSTEOARTHRITIS 11/21/2007  ? Qualifier: Diagnosis of  By: Plotnikov MD, Evie Lacks   ? Plantar fasciitis 08/04/2017  ? Psoriasis   ? Possible psoriatic arthritis 2010  ? Renal calculus   ? RENAL CALCULUS, HX OF 05/10/2008  ? Qualifier: Diagnosis of  By: [place], [place]    ? Screening for malignant neoplasm of colon 03/26/2021  ? SHOULDER IMPINGEMENT SYNDROME, RIGHT 05/10/2008  ? Qualifier: History of  By: [place], [place]    ? SKIN RASH 04/14/2009  ? Qualifier: Diagnosis of  By: Plotnikov MD, Evie Lacks   ? Sleep disorder   ? pt states he doesn't have this anymore  ? SLEEP DISORDER, HX OF 05/10/2008  ? Qualifier: Diagnosis of  By: [place], [place]    ? 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 Tubular adenoma of colon 02/25/2016  ? ? ?Past Surgical History:  ?Procedure Laterality Date  ? COLONOSCOPY    ? CORONARY ANGIOPLASTY WITH STENT PLACEMENT  2013  ? 3 stents  ? FINGER SURGERY    ? POLYPECTOMY    ? ROTATOR CUFF REPAIR    ? right  ? ? ?Current Medications: ?No outpatient medications have been marked as taking for the 03/18/22 encounter (Appointment) with Murl Golladay, Reita Cliche, MD.  ?  ? ?Allergies:   Escitalopram oxalate and Venlafaxine  ? ?Social History  ? ?Socioeconomic History  ? Marital status: Married  ?  Spouse name: Not on file  ? Number of children: 4  ? Years of  education: Not on file  ? Highest education level: Not on file  ?Occupational History  ? Not on file  ?Tobacco Use  ? Smoking status: Former  ?  Packs/day: 0.50  ?  Types: Cigarettes  ?  Quit date: 06/15/2012  ?  Years since quitting: 9.7  ? Smokeless tobacco: Never  ?Vaping Use  ? Vaping Use: Never used  ?Substance and Sexual Activity  ? Alcohol use: Never  ? Drug use: Never  ? Sexual activity: Not on file  ?Other Topics Concern  ? Not on file  ?Social History Narrative  ? Lives at home with wife &  2 sons  ? Right handed  ? ?Social Determinants of Health  ? ?Financial Resource Strain: Not on file  ?Food Insecurity: Not on file  ?Transportation Needs: Not on file  ?Physical Activity: Not on file  ?Stress: Not on file  ?Social Connections: Not on file  ?  ? ?Family History: ?The patient's family history includes Heart attack in his mother; Heart disease in his mother. ? ?ROS:   ?Please see the history of present illness.    ?All other systems reviewed and are negative. ? ?EKGs/Labs/Other Studies Reviewed:   ? ?The following studies were reviewed today: ?I discussed my findings with the patient at length. ? ? ?Recent Labs: ?No results found for requested labs within last 8760 hours.  ?Recent Lipid Panel ?   ?Component Value Date/Time  ? CHOL 125 10/21/2020 1152  ? TRIG 77 10/21/2020 1152  ? HDL 41 10/21/2020 1152  ? CHOLHDL 3.0 10/21/2020 1152  ? CHOLHDL 6.5 CALC 05/30/2007 0918  ? VLDL 15 05/30/2007 0918  ? [place] 69 10/21/2020 1152  ? ? ?Physical Exam:   ? ?VS:  There were no vitals taken for this visit.   ? ?Wt Readings from Last 3 Encounters:  ?12/24/21 191 lb 9.6 oz (86.9 kg)  ?11/05/21 197 lb (89.4 kg)  ?04/20/21 189 lb 12.8 oz (86.1 kg)  ?  ? ?GEN: Patient is in no acute distress ?HEENT: Normal ?NECK: No JVD; No carotid bruits ?LYMPHATICS: No lymphadenopathy ?CARDIAC: Hear sounds regular, 2/6 systolic murmur at the apex. ?RESPIRATORY:  Clear to auscultation without rales, wheezing or rhonchi  ?ABDOMEN: Soft,  non-tender, non-distended ?MUSCULOSKELETAL:  No edema; No deformity  ?SKIN: Warm and dry ?NEUROLOGIC:  Alert and oriented x 3 ?PSYCHIATRIC:  Normal affect  ? ?Signed, ?Jenean Lindau, MD  ?03/18/2022 9:38 AM    ?Faye Ramsay

## 2022-04-05 DIAGNOSIS — I10 Essential (primary) hypertension: Secondary | ICD-10-CM | POA: Diagnosis not present

## 2022-04-05 DIAGNOSIS — E782 Mixed hyperlipidemia: Secondary | ICD-10-CM | POA: Diagnosis not present

## 2022-04-05 DIAGNOSIS — I2510 Atherosclerotic heart disease of native coronary artery without angina pectoris: Secondary | ICD-10-CM | POA: Diagnosis not present

## 2022-04-06 LAB — BASIC METABOLIC PANEL
BUN/Creatinine Ratio: 14 (ref 10–24)
BUN: 12 mg/dL (ref 8–27)
CO2: 26 mmol/L (ref 20–29)
Calcium: 9 mg/dL (ref 8.6–10.2)
Chloride: 107 mmol/L — ABNORMAL HIGH (ref 96–106)
Creatinine, Ser: 0.83 mg/dL (ref 0.76–1.27)
Glucose: 88 mg/dL (ref 70–99)
Potassium: 4.1 mmol/L (ref 3.5–5.2)
Sodium: 143 mmol/L (ref 134–144)
eGFR: 98 mL/min/{1.73_m2} (ref >59)

## 2022-04-06 LAB — LIPID PANEL
Chol/HDL Ratio: 4 ratio (ref 0.0–5.0)
Cholesterol, Total: 155 mg/dL (ref 100–199)
HDL: 39 mg/dL — ABNORMAL LOW (ref >39)
LDL Chol Calc (NIH): 95 mg/dL (ref 0–99)
Triglycerides: 116 mg/dL (ref 0–149)
VLDL Cholesterol Cal: 21 mg/dL (ref 5–40)

## 2022-04-06 LAB — CBC WITH DIFFERENTIAL/PLATELET
Basophils Absolute: 0 10*3/uL (ref 0.0–0.2)
Basos: 1 %
EOS (ABSOLUTE): 0.1 10*3/uL (ref 0.0–0.4)
Eos: 2 %
Hematocrit: 39.3 % (ref 37.5–51.0)
Hemoglobin: 13.5 g/dL (ref 13.0–17.7)
Immature Grans (Abs): 0 10*3/uL (ref 0.0–0.1)
Immature Granulocytes: 0 %
Lymphocytes Absolute: 1.4 10*3/uL (ref 0.7–3.1)
Lymphs: 32 %
MCH: 30.3 pg (ref 26.6–33.0)
MCHC: 34.4 g/dL (ref 31.5–35.7)
MCV: 88 fL (ref 79–97)
Monocytes Absolute: 0.4 10*3/uL (ref 0.1–0.9)
Monocytes: 9 %
Neutrophils Absolute: 2.4 10*3/uL (ref 1.4–7.0)
Neutrophils: 56 %
Platelets: 236 10*3/uL (ref 150–450)
RBC: 4.45 x10E6/uL (ref 4.14–5.80)
RDW: 13.6 % (ref 11.6–15.4)
WBC: 4.2 10*3/uL (ref 3.4–10.8)

## 2022-04-06 LAB — TSH: TSH: 1.8 u[IU]/mL (ref 0.450–4.500)

## 2022-04-06 LAB — HEPATIC FUNCTION PANEL
ALT: 18 IU/L (ref 0–44)
AST: 18 IU/L (ref 0–40)
Albumin: 4.1 g/dL (ref 3.8–4.8)
Alkaline Phosphatase: 82 IU/L (ref 44–121)
Bilirubin Total: <0.2 mg/dL (ref 0.0–1.2)
Bilirubin, Direct: <0.1 mg/dL (ref 0.00–0.40)
Total Protein: 6 g/dL (ref 6.0–8.5)

## 2022-04-14 DIAGNOSIS — I10 Essential (primary) hypertension: Secondary | ICD-10-CM | POA: Diagnosis not present

## 2022-04-14 DIAGNOSIS — I700 Atherosclerosis of aorta: Secondary | ICD-10-CM | POA: Diagnosis not present

## 2022-04-14 DIAGNOSIS — M5414 Radiculopathy, thoracic region: Secondary | ICD-10-CM | POA: Diagnosis not present

## 2022-04-14 DIAGNOSIS — I739 Peripheral vascular disease, unspecified: Secondary | ICD-10-CM | POA: Diagnosis not present

## 2022-04-14 DIAGNOSIS — F331 Major depressive disorder, recurrent, moderate: Secondary | ICD-10-CM | POA: Diagnosis not present

## 2022-04-14 DIAGNOSIS — Z6831 Body mass index (BMI) 31.0-31.9, adult: Secondary | ICD-10-CM | POA: Diagnosis not present

## 2022-04-14 DIAGNOSIS — I2510 Atherosclerotic heart disease of native coronary artery without angina pectoris: Secondary | ICD-10-CM | POA: Diagnosis not present

## 2022-04-14 DIAGNOSIS — R7303 Prediabetes: Secondary | ICD-10-CM | POA: Diagnosis not present

## 2022-04-14 DIAGNOSIS — F411 Generalized anxiety disorder: Secondary | ICD-10-CM | POA: Diagnosis not present

## 2022-04-26 ENCOUNTER — Telehealth: Payer: Self-pay | Admitting: Cardiology

## 2022-04-26 DIAGNOSIS — R824 Acetonuria: Secondary | ICD-10-CM | POA: Diagnosis not present

## 2022-04-26 DIAGNOSIS — Z6829 Body mass index (BMI) 29.0-29.9, adult: Secondary | ICD-10-CM | POA: Diagnosis not present

## 2022-04-26 DIAGNOSIS — R32 Unspecified urinary incontinence: Secondary | ICD-10-CM | POA: Diagnosis not present

## 2022-04-26 NOTE — Telephone Encounter (Signed)
Patient informed of results.  

## 2022-04-26 NOTE — Telephone Encounter (Signed)
Left a message to call back.

## 2022-04-26 NOTE — Telephone Encounter (Signed)
Pt says he would like a call back because he has questions in regards to his labs. He was wanting to know if the labs taken also include kidney function type test. Please advise.

## 2022-04-27 DIAGNOSIS — R824 Acetonuria: Secondary | ICD-10-CM | POA: Diagnosis not present

## 2022-04-28 DIAGNOSIS — R824 Acetonuria: Secondary | ICD-10-CM | POA: Diagnosis not present

## 2022-05-05 DIAGNOSIS — R824 Acetonuria: Secondary | ICD-10-CM | POA: Diagnosis not present

## 2022-05-18 ENCOUNTER — Other Ambulatory Visit: Payer: Self-pay

## 2022-05-18 MED ORDER — METOPROLOL SUCCINATE ER 25 MG PO TB24: 12.5000 mg | ORAL_TABLET | Freq: Every day | ORAL | 2 refills | Status: DC

## 2022-05-24 ENCOUNTER — Ambulatory Visit (INDEPENDENT_AMBULATORY_CARE_PROVIDER_SITE_OTHER): Payer: PPO | Admitting: Cardiology

## 2022-05-24 ENCOUNTER — Encounter: Payer: Self-pay | Admitting: Cardiology

## 2022-05-24 VITALS — BP 120/90 | HR 66 | Ht 68.0 in | Wt 183.0 lb

## 2022-05-24 DIAGNOSIS — I2510 Atherosclerotic heart disease of native coronary artery without angina pectoris: Secondary | ICD-10-CM | POA: Diagnosis not present

## 2022-05-24 DIAGNOSIS — I10 Essential (primary) hypertension: Secondary | ICD-10-CM | POA: Diagnosis not present

## 2022-05-24 DIAGNOSIS — E785 Hyperlipidemia, unspecified: Secondary | ICD-10-CM

## 2022-05-24 NOTE — Patient Instructions (Signed)

## 2022-05-24 NOTE — Progress Notes (Signed)
Cardiology Office Note:    Date:  05/24/2022   ID:  Brandon Wong, DOB [birth year], MRN [id number]  PCP:  Helen Hashimoto., MD  Cardiologist:  Jenean Lindau, MD   Referring MD: Helen Hashimoto., MD    ASSESSMENT:    1. Coronary artery disease involving native coronary artery of native heart without angina pectoris   2. Essential hypertension   3. Dyslipidemia    PLAN:    In order of problems listed above:  Coronary artery disease: Secondary prevention stressed with the patient.  Importance of compliance with diet medication stressed any vocalized understanding.  He was advised to walk at least half an hour a day 5 days a week and he promises to do so. Essential hypertension: Blood pressure stable and diet was emphasized. Mixed dyslipidemia: On lipid-lowering medications lipids were reviewed.  He is taking medications regularly. Patient will be seen in follow-up appointment in 6 months or earlier if the patient has any concerns    Medication Adjustments/Labs and Tests Ordered: Current medicines are reviewed at length with the patient today.  Concerns regarding medicines are outlined above.  No orders of the defined types were placed in this encounter.  No orders of the defined types were placed in this encounter.    Chief Complaint  Patient presents with   Follow-up     History of Present Illness:    Brandon Wong is a 66 y.o. male.  Patient has past medical history of coronary artery disease, essential hypertension and dyslipidemia.  He denies any problems at this time and takes care of activities of daily living.  No chest pain orthopnea or PND.  At the time of my evaluation, the patient is alert awake oriented and in no distress.  Past Medical History:  Diagnosis Date   Anxiety and depression    ANXIETY DEPRESSION 05/10/2008   Qualifier: Diagnosis of  By: [place], [place]     ARTHRITIS 04/14/2009   Qualifier: Diagnosis of  By: Plotnikov MD,  Evie Lacks    Benign prostatic hyperplasia 04/18/2020   Body mass index (BMI) 27.0-27.9, adult 11/08/2019   BREAST MASS, RIGHT 06/13/2008   Qualifier: Diagnosis of  By: Plotnikov MD, Aleksei V    Cellulitis and abscess of unspecified site 06/13/2008   Qualifier: Diagnosis of  By: Plotnikov MD, Evie Lacks    Chronic bilateral low back pain with right-sided sciatica 11/21/2007   Qualifier: Diagnosis of  By: Plotnikov MD, Evie Lacks    Chronic headache    pt states no longer an issue   COLONIC POLYPS, HX OF 11/21/2007   Qualifier: Diagnosis of  By: Plotnikov MD, Evie Lacks    Coronary artery disease involving native coronary artery of native heart without angina pectoris 06/15/2017   DDD (degenerative disc disease), lumbar 10/09/2019   Degenerative disc disease, cervical    DEGENERATIVE [place] DISEASE, CERVICAL SPINE 05/10/2008   Qualifier: Diagnosis of  By: Danny Lawless CMA, [place]     Diffuse abdominal pain 01/14/2022   DOE (dyspnea on exertion) 10/21/2020   Dyslipidemia    Epididymal cyst 07/06/2019   Essential hypertension 05/10/2008   Qualifier: Diagnosis of  By: [place], [place]     Ex-smoker 07/12/2018   External hemorrhoids    Family history of acute myocardial infarction    Gastritis    in the past per pt    GASTRITIS 05/10/2008   Qualifier: History of  By: [place], [place]     GERD 05/10/2008  Qualifier: Diagnosis of  By: Danny Lawless CMA, [place]     GERD (gastroesophageal reflux disease)    in the past per pt    HEADACHE, CHRONIC, HX OF 05/10/2008   Qualifier: Diagnosis of  By: [place], [place]     HEMORRHOIDS, EXTERNAL 05/10/2008   Qualifier: History of  By: [place], [place]     Hemorrhoids, internal, with bleeding 02/21/2020   History of colonic polyps    Hypertension    Impingement syndrome of right shoulder    KNEE PAIN, LEFT 09/12/2009   Qualifier: Diagnosis of  By: Jenny Reichmann MD, Hunt Oris    Lateral epicondylitis  of elbow    Low back pain    Lumbar radiculopathy 10/09/2019   Lumbosacral  radiculopathy at L5 08/28/2019   Meralgia paresthetica    METHICILLIN RESISTANT STAPHYLOCOCCUS AUREUS INFECTION 06/13/2008   Qualifier: Diagnosis of  By: Alain Marion MD, Evie Lacks    Mixed dyslipidemia 05/10/2008   Qualifier: Diagnosis of  By: Danny Lawless CMA, [place]     MRSA (methicillin resistant Staphylococcus aureus) 2009   Skin   Muscle spasm 05/11/2021   Osteoarthritis    OSTEOARTHRITIS 11/21/2007   Qualifier: Diagnosis of  By: Plotnikov MD, Evie Lacks    Plantar fasciitis 08/04/2017   Psoriasis    Possible psoriatic arthritis 2010   Renal calculus    RENAL CALCULUS, HX OF 05/10/2008   Qualifier: Diagnosis of  By: [place], [place]     Screening for malignant neoplasm of colon 03/26/2021   SHOULDER IMPINGEMENT SYNDROME, RIGHT 05/10/2008   Qualifier: History of  By: [place], [place]     SKIN RASH 04/14/2009   Qualifier: Diagnosis of  By: Alain Marion MD, Evie Lacks    Sleep disorder    pt states he doesn't have this anymore   SLEEP DISORDER, HX OF 05/10/2008   Qualifier: Diagnosis of  By: [place], [place]     Spondylosis of lumbar region without myelopathy or radiculopathy 05/11/2021   Tubular adenoma of colon 02/25/2016    Past Surgical History:  Procedure Laterality Date   COLONOSCOPY     CORONARY ANGIOPLASTY WITH STENT PLACEMENT  2013   3 stents   FINGER SURGERY     POLYPECTOMY     ROTATOR CUFF REPAIR     right    Current Medications: Current Meds  Medication Sig   aspirin EC 81 MG tablet Take 81 mg by mouth daily.   atorvastatin (LIPITOR) 40 MG tablet Take 40 mg by mouth daily.   diazepam (VALIUM) 5 MG tablet Take 5 mg by mouth 3 (three) times daily.   esomeprazole (NEXIUM) 40 MG capsule Take 40 mg by mouth daily.   lactulose (CHRONULAC) 10 GM/15ML solution Take 30 mLs by mouth every 6 (six) hours as needed for constipation.   lisinopril (ZESTRIL) 40 MG tablet Take 1 tablet (40 mg total) by mouth daily.   metoprolol succinate (TOPROL-XL) 25 MG 24 hr tablet Take 0.5 tablets (12.5  mg total) by mouth daily.   tadalafil (CIALIS) 5 MG tablet Take 5 mg by mouth daily as needed for erectile dysfunction.     Allergies:   Escitalopram oxalate and Venlafaxine   Social History   Socioeconomic History   Marital status: Married    Spouse name: Not on file   Number of children: 4   Years of education: Not on file   Highest education level: Not on file  Occupational History   Not on file  Tobacco Use  Smoking status: Former    Packs/day: 0.50    Types: Cigarettes    Quit date: 06/15/2012    Years since quitting: 9.9   Smokeless tobacco: Never  Vaping Use   Vaping Use: Never used  Substance and Sexual Activity   Alcohol use: Never   Drug use: Never   Sexual activity: Not on file  Other Topics Concern   Not on file  Social History Narrative   Lives at home with wife & 2 sons   Right handed   Social Determinants of Health   Financial Resource Strain: Not on file  Food Insecurity: Not on file  Transportation Needs: Not on file  Physical Activity: Not on file  Stress: Not on file  Social Connections: Not on file     Family History: The patient's family history includes Heart attack in his mother; Heart disease in his mother.  ROS:   Please see the history of present illness.    All other systems reviewed and are negative.  EKGs/Labs/Other Studies Reviewed:    The following studies were reviewed today: I discussed my findings with the patient at length.   Recent Labs: 04/05/2022: ALT 18; BUN 12; Creatinine, Ser 0.83; Hemoglobin 13.5; Platelets 236; Potassium 4.1; Sodium 143; TSH 1.800  Recent Lipid Panel    Component Value Date/Time   CHOL 155 04/05/2022 0933   TRIG 116 04/05/2022 0933   HDL 39 (L) 04/05/2022 0933   CHOLHDL 4.0 04/05/2022 0933   CHOLHDL 6.5 CALC 05/30/2007 0918   VLDL 15 05/30/2007 0918   LDLCALC 95 04/05/2022 0933    Physical Exam:    VS:  BP 120/90 (BP Location: Right Arm, Patient Position: Sitting, Cuff Size: Normal)    Pulse 66   Ht '5\' 8"'$  (1.727 m)   Wt 183 lb (83 kg)   SpO2 95%   BMI 27.83 kg/m     Wt Readings from Last 3 Encounters:  05/24/22 183 lb (83 kg)  03/18/22 181 lb 1.9 oz (82.2 kg)  12/24/21 191 lb 9.6 oz (86.9 kg)     GEN: Patient is in no acute distress HEENT: Normal NECK: No JVD; No carotid bruits LYMPHATICS: No lymphadenopathy CARDIAC: Hear sounds regular, 2/6 systolic murmur at the apex. RESPIRATORY:  Clear to auscultation without rales, wheezing or rhonchi  ABDOMEN: Soft, non-tender, non-distended MUSCULOSKELETAL:  No edema; No deformity  SKIN: Warm and dry NEUROLOGIC:  Alert and oriented x 3 PSYCHIATRIC:  Normal affect   Signed, Jenean Lindau, MD  05/24/2022 11:49 AM    [place]

## 2022-05-25 DIAGNOSIS — M545 Low back pain, unspecified: Secondary | ICD-10-CM | POA: Diagnosis not present

## 2022-05-27 ENCOUNTER — Telehealth: Payer: Self-pay | Admitting: Cardiology

## 2022-05-27 NOTE — Telephone Encounter (Signed)
error 

## 2022-06-13 DIAGNOSIS — Z20822 Contact with and (suspected) exposure to covid-19: Secondary | ICD-10-CM | POA: Diagnosis not present

## 2022-06-13 DIAGNOSIS — R509 Fever, unspecified: Secondary | ICD-10-CM | POA: Diagnosis not present

## 2022-06-15 DIAGNOSIS — M5126 Other intervertebral disc displacement, lumbar region: Secondary | ICD-10-CM | POA: Diagnosis not present

## 2022-06-15 DIAGNOSIS — M545 Low back pain, unspecified: Secondary | ICD-10-CM | POA: Diagnosis not present

## 2022-06-22 DIAGNOSIS — M545 Low back pain, unspecified: Secondary | ICD-10-CM | POA: Diagnosis not present

## 2022-06-29 DIAGNOSIS — Z6829 Body mass index (BMI) 29.0-29.9, adult: Secondary | ICD-10-CM | POA: Diagnosis not present

## 2022-06-29 DIAGNOSIS — I700 Atherosclerosis of aorta: Secondary | ICD-10-CM | POA: Diagnosis not present

## 2022-07-13 DIAGNOSIS — I2510 Atherosclerotic heart disease of native coronary artery without angina pectoris: Secondary | ICD-10-CM | POA: Diagnosis not present

## 2022-07-13 DIAGNOSIS — K219 Gastro-esophageal reflux disease without esophagitis: Secondary | ICD-10-CM | POA: Diagnosis not present

## 2022-07-13 DIAGNOSIS — F1320 Sedative, hypnotic or anxiolytic dependence, uncomplicated: Secondary | ICD-10-CM | POA: Diagnosis not present

## 2022-07-13 DIAGNOSIS — N400 Enlarged prostate without lower urinary tract symptoms: Secondary | ICD-10-CM | POA: Diagnosis not present

## 2022-07-13 DIAGNOSIS — M1990 Unspecified osteoarthritis, unspecified site: Secondary | ICD-10-CM | POA: Diagnosis not present

## 2022-07-13 DIAGNOSIS — Z7982 Long term (current) use of aspirin: Secondary | ICD-10-CM | POA: Diagnosis not present

## 2022-07-13 DIAGNOSIS — E663 Overweight: Secondary | ICD-10-CM | POA: Diagnosis not present

## 2022-07-13 DIAGNOSIS — E785 Hyperlipidemia, unspecified: Secondary | ICD-10-CM | POA: Diagnosis not present

## 2022-07-13 DIAGNOSIS — I10 Essential (primary) hypertension: Secondary | ICD-10-CM | POA: Diagnosis not present

## 2022-07-13 DIAGNOSIS — K5900 Constipation, unspecified: Secondary | ICD-10-CM | POA: Diagnosis not present

## 2022-07-13 DIAGNOSIS — N529 Male erectile dysfunction, unspecified: Secondary | ICD-10-CM | POA: Diagnosis not present

## 2022-07-13 DIAGNOSIS — F419 Anxiety disorder, unspecified: Secondary | ICD-10-CM | POA: Diagnosis not present

## 2022-08-04 DIAGNOSIS — J45901 Unspecified asthma with (acute) exacerbation: Secondary | ICD-10-CM | POA: Diagnosis not present

## 2022-08-04 DIAGNOSIS — Z6827 Body mass index (BMI) 27.0-27.9, adult: Secondary | ICD-10-CM | POA: Diagnosis not present

## 2022-08-10 ENCOUNTER — Telehealth: Payer: Self-pay | Admitting: Cardiology

## 2022-08-10 MED ORDER — ATORVASTATIN CALCIUM 40 MG PO TABS
40.0000 mg | ORAL_TABLET | Freq: Every day | ORAL | 3 refills | Status: DC
Start: 2022-08-10 — End: 2023-10-17

## 2022-08-10 NOTE — Telephone Encounter (Signed)
RX sent to Brandon Wong for 40 mg daily. I advised pt that his PCP had been ordering. Pt verbalized understanding and will pick up 40 mg dose.

## 2022-08-10 NOTE — Telephone Encounter (Signed)
Patient mentions that pharmacy only fills his  atorvastatin (LIPITOR) 40 MG tablet prescription at 20 mg instead of 40 mg. Please call back to confirm dosage

## 2022-08-17 DIAGNOSIS — F331 Major depressive disorder, recurrent, moderate: Secondary | ICD-10-CM | POA: Diagnosis not present

## 2022-08-17 DIAGNOSIS — I700 Atherosclerosis of aorta: Secondary | ICD-10-CM | POA: Diagnosis not present

## 2022-08-17 DIAGNOSIS — I10 Essential (primary) hypertension: Secondary | ICD-10-CM | POA: Diagnosis not present

## 2022-08-17 DIAGNOSIS — I2510 Atherosclerotic heart disease of native coronary artery without angina pectoris: Secondary | ICD-10-CM | POA: Diagnosis not present

## 2022-08-17 DIAGNOSIS — F411 Generalized anxiety disorder: Secondary | ICD-10-CM | POA: Diagnosis not present

## 2022-08-17 DIAGNOSIS — Z79899 Other long term (current) drug therapy: Secondary | ICD-10-CM | POA: Diagnosis not present

## 2022-08-17 DIAGNOSIS — Z6827 Body mass index (BMI) 27.0-27.9, adult: Secondary | ICD-10-CM | POA: Diagnosis not present

## 2022-08-17 DIAGNOSIS — R7303 Prediabetes: Secondary | ICD-10-CM | POA: Diagnosis not present

## 2022-08-17 DIAGNOSIS — M5414 Radiculopathy, thoracic region: Secondary | ICD-10-CM | POA: Diagnosis not present

## 2022-08-17 DIAGNOSIS — I739 Peripheral vascular disease, unspecified: Secondary | ICD-10-CM | POA: Diagnosis not present

## 2022-08-25 DIAGNOSIS — J029 Acute pharyngitis, unspecified: Secondary | ICD-10-CM | POA: Diagnosis not present

## 2022-08-25 DIAGNOSIS — R0989 Other specified symptoms and signs involving the circulatory and respiratory systems: Secondary | ICD-10-CM | POA: Diagnosis not present

## 2022-08-30 DIAGNOSIS — Z6827 Body mass index (BMI) 27.0-27.9, adult: Secondary | ICD-10-CM | POA: Diagnosis not present

## 2022-08-30 DIAGNOSIS — J302 Other seasonal allergic rhinitis: Secondary | ICD-10-CM | POA: Diagnosis not present

## 2022-08-30 DIAGNOSIS — R070 Pain in throat: Secondary | ICD-10-CM | POA: Diagnosis not present

## 2022-08-30 DIAGNOSIS — I10 Essential (primary) hypertension: Secondary | ICD-10-CM | POA: Diagnosis not present

## 2022-09-17 DIAGNOSIS — Z87891 Personal history of nicotine dependence: Secondary | ICD-10-CM | POA: Diagnosis not present

## 2022-09-17 DIAGNOSIS — J392 Other diseases of pharynx: Secondary | ICD-10-CM | POA: Diagnosis not present

## 2022-09-17 DIAGNOSIS — R490 Dysphonia: Secondary | ICD-10-CM | POA: Diagnosis not present

## 2022-09-28 DIAGNOSIS — Z23 Encounter for immunization: Secondary | ICD-10-CM | POA: Diagnosis not present

## 2022-10-21 DIAGNOSIS — I10 Essential (primary) hypertension: Secondary | ICD-10-CM | POA: Diagnosis not present

## 2022-10-21 DIAGNOSIS — M5414 Radiculopathy, thoracic region: Secondary | ICD-10-CM | POA: Diagnosis not present

## 2022-10-21 DIAGNOSIS — Z6827 Body mass index (BMI) 27.0-27.9, adult: Secondary | ICD-10-CM | POA: Diagnosis not present

## 2022-11-04 DIAGNOSIS — K219 Gastro-esophageal reflux disease without esophagitis: Secondary | ICD-10-CM | POA: Diagnosis not present

## 2022-11-04 DIAGNOSIS — I10 Essential (primary) hypertension: Secondary | ICD-10-CM | POA: Diagnosis not present

## 2022-11-16 ENCOUNTER — Other Ambulatory Visit: Payer: Self-pay

## 2022-11-24 ENCOUNTER — Telehealth: Payer: Self-pay | Admitting: Cardiology

## 2022-11-24 ENCOUNTER — Ambulatory Visit: Payer: PPO | Attending: Cardiology | Admitting: Cardiology

## 2022-11-24 ENCOUNTER — Encounter: Payer: Self-pay | Admitting: Cardiology

## 2022-11-24 VITALS — BP 136/86 | HR 72 | Ht 69.0 in | Wt 177.4 lb

## 2022-11-24 DIAGNOSIS — E785 Hyperlipidemia, unspecified: Secondary | ICD-10-CM | POA: Diagnosis not present

## 2022-11-24 DIAGNOSIS — I2510 Atherosclerotic heart disease of native coronary artery without angina pectoris: Secondary | ICD-10-CM | POA: Diagnosis not present

## 2022-11-24 DIAGNOSIS — I10 Essential (primary) hypertension: Secondary | ICD-10-CM | POA: Diagnosis not present

## 2022-11-24 NOTE — Telephone Encounter (Signed)
Pt was calling to see if he needed to fast for blood work this morning. Advised that if he is taking cholesterol medications it is likely that Dr. Geraldo Pitter would order labs. Pt stated that he did not mind fasting until he comes in. He had no further questions.

## 2022-11-24 NOTE — Progress Notes (Signed)
Cardiology Office Note:    Date:  11/24/2022   ID:  [name], DOB 01/16/1956, MRN [id number]  PCP:  Helen Hashimoto., Wong  Cardiologist:  Jenean Lindau, Wong   Referring Wong: Helen Hashimoto., Wong    ASSESSMENT:    1. Coronary artery disease involving native coronary artery of native heart without angina pectoris   2. Essential hypertension   3. Dyslipidemia    PLAN:    In order of problems listed above:  Coronary artery disease: Secondary prevention stressed with the patient.  Importance of compliance with diet medication stressed any vocalized understanding.  I congratulated him on his excellent exercise program Mixed dyslipidemia: On lipid-lowering medications followed by primary care.  Lipids are not at goal and I questioned him about this and he promises to do better. Essential hypertension: Blood pressure stable and diet was emphasized.  Lifestyle modification urged Cardiac murmur: Echocardiogram will be done to assess murmur heard on auscultation. Patient will be seen in follow-up appointment in 9 months or earlier if the patient has any concerns    Medication Adjustments/Labs and Tests Ordered: Current medicines are reviewed at length with the patient today.  Concerns regarding medicines are outlined above.  No orders of the defined types were placed in this encounter.  No orders of the defined types were placed in this encounter.    No chief complaint on file.    History of Present Illness:    Brandon Wong is a 66 y.o. male.  Patient has past medical history of coronary artery disease, essential hypertension, mixed dyslipidemia.  He denies any problems at this time and takes care of activities of daily living.  No chest pain orthopnea or PND.  At the time of my evaluation, the patient is alert awake oriented and in no distress.  He mentions to me that he walks more than half an hour on a daily basis without any symptoms.  Past Medical History:   Diagnosis Date   Anxiety and depression    ANXIETY DEPRESSION 05/10/2008   Qualifier: Diagnosis of  By: Brandon Wong     ARTHRITIS 04/14/2009   Qualifier: Diagnosis of  By: Brandon Wong    Benign prostatic hyperplasia 04/18/2020   Body mass index (BMI) 27.0-27.9, adult 11/08/2019   BREAST MASS, RIGHT 06/13/2008   Qualifier: Diagnosis of  By: Plotnikov Wong, Brandon V    Cellulitis and abscess of unspecified site 06/13/2008   Qualifier: Diagnosis of  By: Brandon Wong    Chronic bilateral low back pain with right-sided sciatica 11/21/2007   Qualifier: Diagnosis of  By: Brandon Wong    Chronic headache    pt states no longer an issue   COLONIC POLYPS, HX OF 11/21/2007   Qualifier: Diagnosis of  By: Brandon Wong    Coronary artery disease involving native coronary artery of native heart without angina pectoris 06/15/2017   DDD (degenerative disc disease), lumbar 10/09/2019   Degenerative disc disease, cervical    DEGENERATIVE [place] DISEASE, CERVICAL SPINE 05/10/2008   Qualifier: Diagnosis of  By: Brandon Wong     Diffuse abdominal pain 01/14/2022   DOE (dyspnea on exertion) 10/21/2020   Dyslipidemia    Epididymal cyst 07/06/2019   Essential hypertension 05/10/2008   Qualifier: Diagnosis of  By: Brandon Wong     Ex-smoker 07/12/2018   External hemorrhoids    Family history of acute myocardial infarction  Gastritis    in the past per pt    GASTRITIS 05/10/2008   Qualifier: History of  By: [place], [place]     GERD 05/10/2008   Qualifier: Diagnosis of  By: Brandon Wong     GERD (gastroesophageal reflux disease)    in the past per pt    HEADACHE, CHRONIC, HX OF 05/10/2008   Qualifier: Diagnosis of  By: Brandon Wong     HEMORRHOIDS, EXTERNAL 05/10/2008   Qualifier: History of  By: Brandon Wong     Hemorrhoids, internal, with bleeding 02/21/2020   History of colonic polyps    Hypertension    Impingement syndrome of  right shoulder    KNEE PAIN, LEFT 09/12/2009   Qualifier: Diagnosis of  By: Brandon Wong    Lateral epicondylitis  of elbow    Low back pain    Lumbar radiculopathy 10/09/2019   Lumbosacral radiculopathy at L5 08/28/2019   Meralgia paresthetica    METHICILLIN RESISTANT STAPHYLOCOCCUS AUREUS INFECTION 06/13/2008   Qualifier: Diagnosis of  By: Brandon Wong    Mixed dyslipidemia 05/10/2008   Qualifier: Diagnosis of  By: Brandon Wong     MRSA (methicillin resistant Staphylococcus aureus) 2009   Skin   Muscle spasm 05/11/2021   Osteoarthritis    OSTEOARTHRITIS 11/21/2007   Qualifier: Diagnosis of  By: Brandon Wong    Plantar fasciitis 08/04/2017   Psoriasis    Possible psoriatic arthritis 2010   Renal calculus    RENAL CALCULUS, HX OF 05/10/2008   Qualifier: Diagnosis of  By: Brandon Wong     Screening for malignant neoplasm of colon 03/26/2021   SHOULDER IMPINGEMENT SYNDROME, RIGHT 05/10/2008   Qualifier: History of  By: Brandon Wong     SKIN RASH 04/14/2009   Qualifier: Diagnosis of  By: Brandon Wong    Sleep disorder    pt states he doesn't have this anymore   SLEEP DISORDER, HX OF 05/10/2008   Qualifier: Diagnosis of  By: Brandon Wong     Spondylosis of lumbar region without myelopathy or radiculopathy 05/11/2021   Tubular adenoma of colon 02/25/2016    Past Surgical History:  Procedure Laterality Date   COLONOSCOPY     CORONARY ANGIOPLASTY WITH STENT PLACEMENT  2013   3 stents   FINGER SURGERY     POLYPECTOMY     ROTATOR CUFF REPAIR     right    Current Medications: Current Meds  Medication Sig   aspirin EC 81 MG tablet Take 81 mg by mouth daily.   atorvastatin (LIPITOR) 40 MG tablet Take 1 tablet (40 mg total) by mouth daily.   cetirizine (ZYRTEC) 10 MG tablet Take 10 mg by mouth daily.   diazepam (VALIUM) 5 MG tablet Take 5 mg by mouth 3 (three) times daily.   esomeprazole (NEXIUM) 40 MG capsule Take 40 mg by mouth  daily.   lactulose (CHRONULAC) 10 GM/15ML solution Take 30 mLs by mouth every 6 (six) hours as needed for constipation.   tadalafil (CIALIS) 5 MG tablet Take 5 mg by mouth daily as needed for erectile dysfunction.     Allergies:   Escitalopram oxalate and Venlafaxine   Social History   Socioeconomic History   Marital status: Married    Spouse name: Not on file   Number of children: 4   Years of education: Not on file   Highest education level: Not on file  Occupational History   Not on file  Tobacco Use   Smoking status: Former    Packs/day: 0.50    Types: Cigarettes    Quit date: 06/15/2012    Years since quitting: 10.4   Smokeless tobacco: Never  Vaping Use   Vaping Use: Never used  Substance and Sexual Activity   Alcohol use: Never   Drug use: Never   Sexual activity: Not on file  Other Topics Concern   Not on file  Social History Narrative   Lives at home with wife & 2 sons   Right handed   Social Determinants of Health   Financial Resource Strain: Not on file  Food Insecurity: Not on file  Transportation Needs: Not on file  Physical Activity: Not on file  Stress: Not on file  Social Connections: Not on file     Family History: The patient's family history includes Heart attack in his mother; Heart disease in his mother.  ROS:   Please see the history of present illness.    All other systems reviewed and are negative.  EKGs/Labs/Other Studies Reviewed:    The following studies were reviewed today: I discussed my findings with the patient at length.  EKG revealed sinus rhythm and nonspecific ST-T changes.   Recent Labs: 04/05/2022: ALT 18; BUN 12; Creatinine, Ser 0.83; Hemoglobin 13.5; Platelets 236; Potassium 4.1; Sodium 143; TSH 1.800  Recent Lipid Panel    Component Value Date/Time   CHOL 155 04/05/2022 0933   TRIG 116 04/05/2022 0933   HDL 39 (L) 04/05/2022 0933   CHOLHDL 4.0 04/05/2022 0933   CHOLHDL 6.5 CALC 05/30/2007 0918   VLDL 15  05/30/2007 0918   LDLCALC 95 04/05/2022 0933    Physical Exam:    VS:  BP 136/86   Pulse 72   Ht '5\' 9"'$  (1.753 m)   Wt 177 lb 6.4 oz (80.5 kg)   SpO2 97%   BMI 26.20 kg/m     Wt Readings from Last 3 Encounters:  11/24/22 177 lb 6.4 oz (80.5 kg)  05/24/22 183 lb (83 kg)  03/18/22 181 lb 1.9 oz (82.2 kg)     GEN: Patient is in no acute distress HEENT: Normal NECK: No JVD; No carotid bruits LYMPHATICS: No lymphadenopathy CARDIAC: Hear sounds regular, 2/6 systolic murmur at the apex. RESPIRATORY:  Clear to auscultation without rales, wheezing or rhonchi  ABDOMEN: Soft, non-tender, non-distended MUSCULOSKELETAL:  No edema; No deformity  SKIN: Warm and dry NEUROLOGIC:  Alert and oriented x 3 PSYCHIATRIC:  Normal affect   Signed, Jenean Lindau, Wong  11/24/2022 11:20 AM    [place]

## 2022-11-24 NOTE — Patient Instructions (Signed)
Medication Instructions:  ?Your physician recommends that you continue on your current medications as directed. Please refer to the Current Medication list given to you today. ? ?*If you need a refill on your cardiac medications before your next appointment, please call your pharmacy* ? ? ?Lab Work: ?None ordered ?If you have labs (blood work) drawn today and your tests are completely normal, you will receive your results only by: ?MyChart Message (if you have MyChart) OR ?A paper copy in the mail ?If you have any lab test that is abnormal or we need to change your treatment, we will call you to review the results. ? ? ?Testing/Procedures: ?Your physician has requested that you have an echocardiogram. Echocardiography is a painless test that uses sound waves to create images of your heart. It provides your doctor with information about the size and shape of your heart and how well your heart?s chambers and valves are working. This procedure takes approximately one hour. There are no restrictions for this procedure. ? ? ? ?Follow-Up: ?At [facility], you and your health needs are our priority.  As part of our continuing mission to provide you with exceptional heart care, we have created designated Provider Care Teams.  These Care Teams include your primary Cardiologist (physician) and Advanced Practice Providers (APPs -  Physician Assistants and Nurse Practitioners) who all work together to provide you with the care you need, when you need it. ? ?We recommend signing up for the patient portal called "MyChart".  Sign up information is provided on this After Visit Summary.  MyChart is used to connect with patients for Virtual Visits (Telemedicine).  Patients are able to view lab/test results, encounter notes, upcoming appointments, etc.  Non-urgent messages can be sent to your provider as well.   ?To learn more about what you can do with MyChart, go to [email or web address].   ? ?Your next appointment:   ?9  month(s) ? ?The format for your next appointment:   ?In Person ? ?Provider:   ?Rajan Revankar, MD ? ? ?Other Instructions ?Echocardiogram ?An echocardiogram is a test that uses sound waves (ultrasound) to produce images of the heart. ?Images from an echocardiogram can provide important information about: ?Heart size and shape. ?The size and thickness and movement of your heart's walls. ?Heart muscle function and strength. ?Heart valve function or if you have stenosis. Stenosis is when the heart valves are too narrow. ?If blood is flowing backward through the heart valves (regurgitation). ?A tumor or infectious growth around the heart valves. ?Areas of heart muscle that are not working well because of poor blood flow or injury from a heart attack. ?Aneurysm detection. An aneurysm is a weak or damaged part of an artery wall. The wall bulges out from the normal force of blood pumping through the body. ?Tell a health care provider about: ?Any allergies you have. ?All medicines you are taking, including vitamins, herbs, eye drops, creams, and over-the-counter medicines. ?Any blood disorders you have. ?Any surgeries you have had. ?Any medical conditions you have. ?Whether you are pregnant or may be pregnant. ?What are the risks? ?Generally, this is a safe test. However, problems may occur, including an allergic reaction to dye (contrast) that may be used during the test. ?What happens before the test? ?No specific preparation is needed. You may eat and drink normally. ?What happens during the test? ?You will take off your clothes from the waist up and put on a hospital gown. ?Electrodes or electrocardiogram (ECG)patches may be placed on   your chest. The electrodes or patches are then connected to a device that monitors your heart rate and rhythm. ?You will lie down on a table for an ultrasound exam. A gel will be applied to your chest to help sound waves pass through your skin. ?A handheld device, called a transducer, will  be pressed against your chest and moved over your heart. The transducer produces sound waves that travel to your heart and bounce back (or "echo" back) to the transducer. These sound waves will be captured in real-time and changed into images of your heart that can be viewed on a video monitor. The images will be recorded on a computer and reviewed by your health care provider. ?You may be asked to change positions or hold your breath for a short time. This makes it easier to get different views or better views of your heart. ?In some cases, you may receive contrast through an IV in one of your veins. This can improve the quality of the pictures from your heart. ?The procedure may vary among health care providers and hospitals.   ?What can I expect after the test? ?You may return to your normal, everyday life, including diet, activities, and medicines, unless your health care provider tells you not to do that. ?Follow these instructions at home: ?It is up to you to get the results of your test. Ask your health care provider, or the department that is doing the test, when your results will be ready. ?Keep all follow-up visits. This is important. ?Summary ?An echocardiogram is a test that uses sound waves (ultrasound) to produce images of the heart. ?Images from an echocardiogram can provide important information about the size and shape of your heart, heart muscle function, heart valve function, and other possible heart problems. ?You do not need to do anything to prepare before this test. You may eat and drink normally. ?After the echocardiogram is completed, you may return to your normal, everyday life, unless your health care provider tells you not to do that. ?This information is not intended to replace advice given to you by your health care provider. Make sure you discuss any questions you have with your health care provider. ?Document Revised: 07/15/2020 Document Reviewed: 07/15/2020 ?Elsevier Patient  Education ? 2021 [employer] ? ? ?

## 2022-11-24 NOTE — Addendum Note (Signed)
Addended by: Truddie Hidden on: 11/24/2022 11:31 AM   Modules accepted: Orders

## 2022-11-24 NOTE — Telephone Encounter (Signed)
Patient calling to see if he suppose to have labs done today. Please advise

## 2022-11-25 ENCOUNTER — Ambulatory Visit: Payer: PPO | Attending: Cardiology

## 2022-11-25 DIAGNOSIS — I2510 Atherosclerotic heart disease of native coronary artery without angina pectoris: Secondary | ICD-10-CM

## 2022-11-25 LAB — ECHOCARDIOGRAM COMPLETE
Area-P 1/2: 2.95 cm2
S' Lateral: 4 cm

## 2022-12-07 DIAGNOSIS — H6121 Impacted cerumen, right ear: Secondary | ICD-10-CM | POA: Diagnosis not present

## 2022-12-07 DIAGNOSIS — H9191 Unspecified hearing loss, right ear: Secondary | ICD-10-CM | POA: Diagnosis not present

## 2023-01-11 DIAGNOSIS — R972 Elevated prostate specific antigen [PSA]: Secondary | ICD-10-CM | POA: Diagnosis not present

## 2023-01-19 DIAGNOSIS — N138 Other obstructive and reflux uropathy: Secondary | ICD-10-CM | POA: Diagnosis not present

## 2023-01-19 DIAGNOSIS — N401 Enlarged prostate with lower urinary tract symptoms: Secondary | ICD-10-CM | POA: Diagnosis not present

## 2023-02-15 DIAGNOSIS — I739 Peripheral vascular disease, unspecified: Secondary | ICD-10-CM | POA: Diagnosis not present

## 2023-02-15 DIAGNOSIS — Z6828 Body mass index (BMI) 28.0-28.9, adult: Secondary | ICD-10-CM | POA: Diagnosis not present

## 2023-02-15 DIAGNOSIS — I700 Atherosclerosis of aorta: Secondary | ICD-10-CM | POA: Diagnosis not present

## 2023-02-15 DIAGNOSIS — M5414 Radiculopathy, thoracic region: Secondary | ICD-10-CM | POA: Diagnosis not present

## 2023-02-15 DIAGNOSIS — I10 Essential (primary) hypertension: Secondary | ICD-10-CM | POA: Diagnosis not present

## 2023-02-20 DIAGNOSIS — T148XXA Other injury of unspecified body region, initial encounter: Secondary | ICD-10-CM | POA: Diagnosis not present

## 2023-02-20 DIAGNOSIS — K5900 Constipation, unspecified: Secondary | ICD-10-CM | POA: Diagnosis not present

## 2023-02-20 DIAGNOSIS — R109 Unspecified abdominal pain: Secondary | ICD-10-CM | POA: Diagnosis not present

## 2023-02-20 DIAGNOSIS — R1084 Generalized abdominal pain: Secondary | ICD-10-CM | POA: Diagnosis not present

## 2023-02-23 DIAGNOSIS — Z6828 Body mass index (BMI) 28.0-28.9, adult: Secondary | ICD-10-CM | POA: Diagnosis not present

## 2023-02-23 DIAGNOSIS — M792 Neuralgia and neuritis, unspecified: Secondary | ICD-10-CM | POA: Diagnosis not present

## 2023-03-08 ENCOUNTER — Ambulatory Visit (INDEPENDENT_AMBULATORY_CARE_PROVIDER_SITE_OTHER): Payer: PPO | Admitting: Podiatry

## 2023-03-08 DIAGNOSIS — M792 Neuralgia and neuritis, unspecified: Secondary | ICD-10-CM

## 2023-03-08 DIAGNOSIS — G629 Polyneuropathy, unspecified: Secondary | ICD-10-CM

## 2023-03-08 NOTE — Progress Notes (Signed)
Subjective:  Patient ID: Brandon Wong, male    DOB: May 29, 1956,  MRN: [id number]  Chief Complaint  Patient presents with   Foot Problem    Patient came in today for bilateral foot burning, started 3 months ago,     67 y.o. male presents for concern for bilateral foot burning sensation.  He does have a history of low back pain and low back problems has known arthritis and bone spurs in his spine.  He says that his primary care doctor placed him on gabapentin 100 mg 3 times daily but he only takes this occasionally when he has pain.  He has not been taking it very much however.  He also reports a small lesion on the bottom of his right foot that he has had for many years but does not have pain with it only if he presses on it he sometimes trims it down.  Past Medical History:  Diagnosis Date   Anxiety and depression    ANXIETY DEPRESSION 05/10/2008   Qualifier: Diagnosis of  By: [place], [place]     ARTHRITIS 04/14/2009   Qualifier: Diagnosis of  By: Plotnikov MD, Evie Lacks    Benign prostatic hyperplasia 04/18/2020   Body mass index (BMI) 27.0-27.9, adult 11/08/2019   BREAST MASS, RIGHT 06/13/2008   Qualifier: Diagnosis of  By: Plotnikov MD, Aleksei V    Cellulitis and abscess of unspecified site 06/13/2008   Qualifier: Diagnosis of  By: Plotnikov MD, Evie Lacks    Chronic bilateral low back pain with right-sided sciatica 11/21/2007   Qualifier: Diagnosis of  By: Plotnikov MD, Evie Lacks    Chronic headache    pt states no longer an issue   COLONIC POLYPS, HX OF 11/21/2007   Qualifier: Diagnosis of  By: Plotnikov MD, Evie Lacks    Coronary artery disease involving native coronary artery of native heart without angina pectoris 06/15/2017   DDD (degenerative disc disease), lumbar 10/09/2019   Degenerative disc disease, cervical    DEGENERATIVE [place] DISEASE, CERVICAL SPINE 05/10/2008   Qualifier: Diagnosis of  By: Danny Lawless CMA, [place]     Diffuse abdominal pain 01/14/2022   DOE (dyspnea on  exertion) 10/21/2020   Dyslipidemia    Epididymal cyst 07/06/2019   Essential hypertension 05/10/2008   Qualifier: Diagnosis of  By: [place], [place]     Ex-smoker 07/12/2018   External hemorrhoids    Family history of acute myocardial infarction    Gastritis    in the past per pt    GASTRITIS 05/10/2008   Qualifier: History of  By: [place], [place]     GERD 05/10/2008   Qualifier: Diagnosis of  By: Danny Lawless CMA, [place]     GERD (gastroesophageal reflux disease)    in the past per pt    HEADACHE, CHRONIC, HX OF 05/10/2008   Qualifier: Diagnosis of  By: [place], [place]     HEMORRHOIDS, EXTERNAL 05/10/2008   Qualifier: History of  By: [place], [place]     Hemorrhoids, internal, with bleeding 02/21/2020   History of colonic polyps    Hypertension    Impingement syndrome of right shoulder    KNEE PAIN, LEFT 09/12/2009   Qualifier: Diagnosis of  By: Jenny Reichmann MD, [place] Or[place]    Lateral epicondylitis  of elbow    Low back pain    Lumbar radiculopathy 10/09/2019   Lumbosacral radiculopathy at L5 08/28/2019   Meralgia paresthetica    METHICILLIN RESISTANT STAPHYLOCOCCUS AUREUS INFECTION 06/13/2008  Qualifier: Diagnosis of  By: Plotnikov MD, Evie Lacks    Mixed dyslipidemia 05/10/2008   Qualifier: Diagnosis of  By: Danny Lawless CMA, [place]     MRSA (methicillin resistant Staphylococcus aureus) 2009   Skin   Muscle spasm 05/11/2021   Osteoarthritis    OSTEOARTHRITIS 11/21/2007   Qualifier: Diagnosis of  By: Plotnikov MD, Evie Lacks    Plantar fasciitis 08/04/2017   Psoriasis    Possible psoriatic arthritis 2010   Renal calculus    RENAL CALCULUS, HX OF 05/10/2008   Qualifier: Diagnosis of  By: Eugenio Saenz, [place]     Screening for malignant neoplasm of colon 03/26/2021   SHOULDER IMPINGEMENT SYNDROME, RIGHT 05/10/2008   Qualifier: History of  By: [place], [place]     SKIN RASH 04/14/2009   Qualifier: Diagnosis of  By: Alain Marion MD, Evie Lacks    Sleep disorder    pt states he doesn't have this anymore    SLEEP DISORDER, HX OF 05/10/2008   Qualifier: Diagnosis of  By: Danny Lawless CMA, [place]     Spondylosis of lumbar region without myelopathy or radiculopathy 05/11/2021   Tubular adenoma of colon 02/25/2016    Allergies  Allergen Reactions   Escitalopram Oxalate     Intolerant   Venlafaxine     Intolerant    ROS: Negative except as per HPI above  Objective:  General: AAO x3, NAD  Dermatological: Circular area of mildly abnormal skin with hyperkeratotic tissue overlying and pain with side-to-side squeeze.  Vascular:  Dorsalis Pedis artery and Posterior Tibial artery pedal pulses are 2/4 bilateral.  Capillary fill time < 3 sec to all digits.   Neruologic: Grossly intact via light touch bilateral. Protective threshold intact to all sites bilateral.  Patient has subjective sensation of bilateral burning pain.  Musculoskeletal: No gross boney pedal deformities bilateral. No pain, crepitus, or limitation noted with foot and ankle range of motion bilateral. Muscular strength 5/5 in all groups tested bilateral.  Gait: Unassisted, Nonantalgic.   No images are attached to the encounter.  Radiographs:  Deferred Assessment:   1. Neuropathic pain of foot   2. Neuropathy      Plan:  Patient was evaluated and treated and all questions answered.  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 -Evidenced by burning sensation and pains especially after he has been walking. -Recommend taking the gabapentin as previously prescribed 100 mg 3 times a day.  Patient educated that he must take the medication on a routine basis to have effect.  He says he will begin taking the medication  # Plantar wart right foot -Possible mild plantars wart on the right plantar forefoot -Patient does not have pain with this area we will continue to monitor at this time -Discussed if he gets worse we will do  wart treatment with Cantharone  Return if symptoms worsen or fail to improve.          Everitt Amber, DPM [facility]

## 2023-04-14 DIAGNOSIS — Z6827 Body mass index (BMI) 27.0-27.9, adult: Secondary | ICD-10-CM | POA: Diagnosis not present

## 2023-04-14 DIAGNOSIS — I700 Atherosclerosis of aorta: Secondary | ICD-10-CM | POA: Diagnosis not present

## 2023-04-14 DIAGNOSIS — R1032 Left lower quadrant pain: Secondary | ICD-10-CM | POA: Diagnosis not present

## 2023-04-14 DIAGNOSIS — R109 Unspecified abdominal pain: Secondary | ICD-10-CM | POA: Diagnosis not present

## 2023-05-01 DIAGNOSIS — J0100 Acute maxillary sinusitis, unspecified: Secondary | ICD-10-CM | POA: Diagnosis not present

## 2023-05-16 DIAGNOSIS — Z6828 Body mass index (BMI) 28.0-28.9, adult: Secondary | ICD-10-CM | POA: Diagnosis not present

## 2023-05-16 DIAGNOSIS — J208 Acute bronchitis due to other specified organisms: Secondary | ICD-10-CM | POA: Diagnosis not present

## 2023-06-29 DIAGNOSIS — Z0000 Encounter for general adult medical examination without abnormal findings: Secondary | ICD-10-CM | POA: Diagnosis not present

## 2023-06-29 DIAGNOSIS — Z9181 History of falling: Secondary | ICD-10-CM | POA: Diagnosis not present

## 2023-07-25 DIAGNOSIS — Z6825 Body mass index (BMI) 25.0-25.9, adult: Secondary | ICD-10-CM | POA: Diagnosis not present

## 2023-07-25 DIAGNOSIS — R1013 Epigastric pain: Secondary | ICD-10-CM | POA: Diagnosis not present

## 2023-07-28 DIAGNOSIS — K7689 Other specified diseases of liver: Secondary | ICD-10-CM | POA: Diagnosis not present

## 2023-07-28 DIAGNOSIS — R1013 Epigastric pain: Secondary | ICD-10-CM | POA: Diagnosis not present

## 2023-08-16 DIAGNOSIS — M792 Neuralgia and neuritis, unspecified: Secondary | ICD-10-CM | POA: Diagnosis not present

## 2023-08-16 DIAGNOSIS — I700 Atherosclerosis of aorta: Secondary | ICD-10-CM | POA: Diagnosis not present

## 2023-08-16 DIAGNOSIS — I2510 Atherosclerotic heart disease of native coronary artery without angina pectoris: Secondary | ICD-10-CM | POA: Diagnosis not present

## 2023-08-16 DIAGNOSIS — I739 Peripheral vascular disease, unspecified: Secondary | ICD-10-CM | POA: Diagnosis not present

## 2023-08-16 DIAGNOSIS — I10 Essential (primary) hypertension: Secondary | ICD-10-CM | POA: Diagnosis not present

## 2023-08-16 DIAGNOSIS — E538 Deficiency of other specified B group vitamins: Secondary | ICD-10-CM | POA: Diagnosis not present

## 2023-08-16 DIAGNOSIS — Z6825 Body mass index (BMI) 25.0-25.9, adult: Secondary | ICD-10-CM | POA: Diagnosis not present

## 2023-08-16 DIAGNOSIS — F331 Major depressive disorder, recurrent, moderate: Secondary | ICD-10-CM | POA: Diagnosis not present

## 2023-09-07 ENCOUNTER — Ambulatory Visit: Payer: PPO | Attending: Cardiology | Admitting: Cardiology

## 2023-09-07 ENCOUNTER — Encounter: Payer: Self-pay | Admitting: Cardiology

## 2023-09-07 VITALS — BP 130/84 | HR 64 | Ht 69.0 in | Wt 176.0 lb

## 2023-09-07 DIAGNOSIS — R079 Chest pain, unspecified: Secondary | ICD-10-CM | POA: Insufficient documentation

## 2023-09-07 DIAGNOSIS — Z23 Encounter for immunization: Secondary | ICD-10-CM | POA: Diagnosis not present

## 2023-09-07 DIAGNOSIS — I714 Abdominal aortic aneurysm, without rupture, unspecified: Secondary | ICD-10-CM | POA: Insufficient documentation

## 2023-09-07 DIAGNOSIS — I2510 Atherosclerotic heart disease of native coronary artery without angina pectoris: Secondary | ICD-10-CM

## 2023-09-07 DIAGNOSIS — I10 Essential (primary) hypertension: Secondary | ICD-10-CM | POA: Diagnosis not present

## 2023-09-07 DIAGNOSIS — E782 Mixed hyperlipidemia: Secondary | ICD-10-CM

## 2023-09-07 MED ORDER — NITROGLYCERIN 0.4 MG SL SUBL: 0.4000 mg | SUBLINGUAL_TABLET | SUBLINGUAL | 6 refills | Status: DC | PRN

## 2023-09-07 NOTE — Progress Notes (Addendum)
Cardiology Office Note:    Date:  09/07/2023   ID:  Brandon Wong, DOB [birth year], MRN [id number]  PCP:  Wilmer Floor., MD  Cardiologist:  Garwin Brothers, MD   Referring MD: Wilmer Floor., MD    ASSESSMENT:    1. Chest pain of uncertain etiology   2. Coronary artery disease involving native coronary artery of native heart without angina pectoris   3. Essential hypertension   4. Mixed dyslipidemia   5. Chest pain, unspecified type   6. Abdominal aortic aneurysm (AAA) without rupture, unspecified part (HCC)    PLAN:    In order of problems listed above:  Coronary artery disease: Chest pain: Secondary prevention stressed with the patient.  Importance of compliance with diet medication stressed any vocalized understanding.  In view of chest pain will do exercise stress Cardiolite.  This looks atypical. Essential hypertension: Recently blood pressure medications were discontinued because of borderline blood pressure.  I told him to keep a track of his blood pressures at home and bring [place] a log when he comes for the stress test. Mixed dyslipidemia: On lipid-lowering medications.  He will have blood work when he comes for the stress test. Sublingual nitroglycerin prescription was sent, its protocol and 911 protocol explained and the patient vocalized understanding questions were answered to the patient's satisfaction Patient will be seen in follow-up appointment in 6 months or earlier if the patient has any concerns. Abdominal aortic aneurysm was noted on CT scan of the belly.  I discussed this with him at length.  Will be following this on an annual basis at this time.   Medication Adjustments/Labs and Tests Ordered: Current medicines are reviewed at length with the patient today.  Concerns regarding medicines are outlined above.  Orders Placed This Encounter  Procedures   Cardiac Stress Test: Informed Consent Details: Physician/Practitioner Attestation; Transcribe  to consent form and obtain patient signature   MYOCARDIAL PERFUSION IMAGING   Meds ordered this encounter  Medications   DISCONTD: nitroGLYCERIN (NITROSTAT) 0.4 MG SL tablet    Sig: Place 1 tablet (0.4 mg total) under the tongue every 5 (five) minutes as needed.    Dispense:  25 tablet    Refill:  6     No chief complaint on file.    History of Present Illness:    Brandon Wong is a 66 y.o. male.  Patient has a past medical history of coronary artery disease, essential hypertension and dyslipidemia.  He mentions to me that he occasionally has chest tightness.  It is not related to exertion.  He walks 30 to 40 minutes many days.  With this he has no symptoms.  At the time of my evaluation, the patient is alert awake oriented and in no distress.  Past Medical History:  Diagnosis Date   Anxiety and depression    ANXIETY DEPRESSION 05/10/2008   Qualifier: Diagnosis of  By: Genelle Gather CMA, [place]     ARTHRITIS 04/14/2009   Qualifier: Diagnosis of   By: Posey Rea MD, Georgina Quint     Replacing diagnoses that were inactivated after the 03/07/23 regulatory import     Benign prostatic hyperplasia 04/18/2020   Body mass index (BMI) 27.0-27.9, adult 11/08/2019   BREAST MASS, RIGHT 06/13/2008   Qualifier: Diagnosis of  By: Plotnikov MD, Aleksei V    Cellulitis and abscess of unspecified site 06/13/2008   Qualifier: Diagnosis of  By: Plotnikov MD, Georgina Quint    Chronic bilateral low back pain  with right-sided sciatica 11/21/2007   Qualifier: Diagnosis of  By: Plotnikov MD, Georgina Quint    Chronic headache    pt states no longer an issue   COLONIC POLYPS, HX OF 11/21/2007   Qualifier: Diagnosis of  By: Plotnikov MD, Georgina Quint    Coronary artery disease involving native coronary artery of native heart without angina pectoris 06/15/2017   DDD (degenerative disc disease), lumbar 10/09/2019   Degenerative disc disease, cervical    DEGENERATIVE DISC DISEASE, CERVICAL SPINE 05/10/2008   Qualifier:  Diagnosis of  By: Genelle Gather CMA, [place]     Diffuse abdominal pain 01/14/2022   DOE (dyspnea on exertion) 10/21/2020   Dyslipidemia    Epididymal cyst 07/06/2019   Essential hypertension 05/10/2008   Qualifier: Diagnosis of  By: Genelle Gather CMA, [place]     Ex-smoker 07/12/2018   External hemorrhoids    Family history of acute myocardial infarction    Gastritis    in the past per pt    GERD 05/10/2008   Qualifier: Diagnosis of  By: Genelle Gather CMA, [place]     GERD (gastroesophageal reflux disease)    in the past per pt    Hemorrhoids, internal, with bleeding 02/21/2020   History of colonic polyps    Hypertension    Impingement syndrome of right shoulder    KNEE PAIN, LEFT 09/12/2009   Qualifier: Diagnosis of  By: Jonny Ruiz MD, Len Blalock    LATERAL EPICONDYLITIS OF ELBOW 05/10/2008   Qualifier: History of   By: Genelle Gather CMA, [place]      Replacing diagnoses that were inactivated after the 03/07/23 regulatory import     Low back pain    Lumbar radiculopathy 10/09/2019   Lumbosacral radiculopathy at L5 08/28/2019   Meralgia paresthetica    METHICILLIN RESISTANT STAPHYLOCOCCUS AUREUS INFECTION 06/13/2008   Qualifier: Diagnosis of  By: Posey Rea MD, Georgina Quint    Mixed dyslipidemia 05/10/2008   Qualifier: Diagnosis of  By: Genelle Gather CMA, [place]     MRSA (methicillin resistant Staphylococcus aureus) 2009   Skin   Muscle spasm 05/11/2021   Osteoarthritis    Plantar fasciitis 08/04/2017   Psoriasis    Possible psoriatic arthritis 2010   Renal calculus    RENAL CALCULUS, HX OF 05/10/2008   Qualifier: Diagnosis of  By: Genelle Gather CMA, [place]     Screening for malignant neoplasm of colon 03/26/2021   SHOULDER IMPINGEMENT SYNDROME, RIGHT 05/10/2008   Qualifier: History of  By: Genelle Gather CMA, [place]     SKIN RASH 04/14/2009   Qualifier: Diagnosis of  By: Posey Rea MD, Georgina Quint    Sleep disorder    pt states he doesn't have this anymore   SLEEP DISORDER, HX OF 05/10/2008   Qualifier: Diagnosis of  By:  Genelle Gather CMA, [place]     Spondylosis of lumbar region without myelopathy or radiculopathy 05/11/2021   Tubular adenoma of colon 02/25/2016    Past Surgical History:  Procedure Laterality Date   COLONOSCOPY     CORONARY ANGIOPLASTY WITH STENT PLACEMENT  2013   3 stents   FINGER SURGERY     POLYPECTOMY     ROTATOR CUFF REPAIR     right    Current Medications: Current Meds  Medication Sig   aspirin EC 81 MG tablet Take 81 mg by mouth daily.   atorvastatin (LIPITOR) 40 MG tablet Take 1 tablet (40 mg total) by mouth daily.   cetirizine (ZYRTEC) 10 MG tablet Take 10 mg by mouth daily.   esomeprazole (NEXIUM)  40 MG capsule Take 40 mg by mouth daily.   lactulose (CHRONULAC) 10 GM/15ML solution Take 30 mLs by mouth every 6 (six) hours as needed for constipation.   lisinopril (ZESTRIL) 40 MG tablet Take 20 mg by mouth as needed (for high blood pressure).   tadalafil (CIALIS) 5 MG tablet Take 5 mg by mouth daily as needed for erectile dysfunction.   [DISCONTINUED] nitroGLYCERIN (NITROSTAT) 0.4 MG SL tablet Place 1 tablet (0.4 mg total) under the tongue every 5 (five) minutes as needed.     Allergies:   Escitalopram oxalate and Venlafaxine   Social History   Socioeconomic History   Marital status: Married    Spouse name: Not on file   Number of children: 4   Years of education: Not on file   Highest education level: Not on file  Occupational History   Not on file  Tobacco Use   Smoking status: Former    Current packs/day: 0.00    Types: Cigarettes    Quit date: 06/15/2012    Years since quitting: 11.2   Smokeless tobacco: Never  Vaping Use   Vaping status: Never Used  Substance and Sexual Activity   Alcohol use: Never   Drug use: Never   Sexual activity: Not on file  Other Topics Concern   Not on file  Social History Narrative   Lives at home with wife & 2 sons   Right handed   Social Determinants of Health   Financial Resource Strain: Not on file  Food Insecurity:  Not on file  Transportation Needs: Not on file  Physical Activity: Not on file  Stress: Not on file  Social Connections: Not on file     Family History: The patient's family history includes Heart attack in his mother; Heart disease in his mother.  ROS:   Please see the history of present illness.    All other systems reviewed and are negative.  EKGs/Labs/Other Studies Reviewed:    The following studies were reviewed today: I discussed my findings with the patient at length   Recent Labs: No results found for requested labs within last 365 days.  Recent Lipid Panel    Component Value Date/Time   CHOL 155 04/05/2022 0933   TRIG 116 04/05/2022 0933   HDL 39 (L) 04/05/2022 0933   CHOLHDL 4.0 04/05/2022 0933   CHOLHDL 6.5 CALC 05/30/2007 0918   VLDL 15 05/30/2007 0918   LDLCALC 95 04/05/2022 0933    Physical Exam:    VS:  BP (!) 146/96   Pulse 64   Ht 5\' 9"  (1.753 m)   Wt 176 lb (79.8 kg)   SpO2 96%   BMI 25.99 kg/m     Wt Readings from Last 3 Encounters:  09/07/23 176 lb (79.8 kg)  11/24/22 177 lb 6.4 oz (80.5 kg)  05/24/22 183 lb (83 kg)     GEN: Patient is in no acute distress HEENT: Normal NECK: No JVD; No carotid bruits LYMPHATICS: No lymphadenopathy CARDIAC: Hear sounds regular, 2/6 systolic murmur at the apex. RESPIRATORY:  Clear to auscultation without rales, wheezing or rhonchi  ABDOMEN: Soft, non-tender, non-distended MUSCULOSKELETAL:  No edema; No deformity  SKIN: Warm and dry NEUROLOGIC:  Alert and oriented x 3 PSYCHIATRIC:  Normal affect   Signed, Garwin Brothers, MD  09/07/2023 3:09 PM    [facility]

## 2023-09-07 NOTE — Addendum Note (Signed)
Addended by: Eleonore Chiquito on: 09/07/2023 02:49 PM   Modules accepted: Orders

## 2023-09-07 NOTE — Patient Instructions (Signed)
Please keep a BP log for 2 weeks and send by MyChart or mail.  Blood Pressure Record Sheet To take your blood pressure, you will need a blood pressure machine. You can buy a blood pressure machine (blood pressure monitor) at your clinic, drug store, or online. When choosing one, consider: An automatic monitor that has an arm cuff. A cuff that wraps snugly around your upper arm. You should be able to fit only one finger between your arm and the cuff. A device that stores blood pressure reading results. Do not choose a monitor that measures your blood pressure from your wrist or finger. Follow your health care provider's instructions for how to take your blood pressure. To use this form: Get one reading in the morning (a.m.) 1-2 hours after you take any medicines. Get one reading in the evening (p.m.) before supper.   Blood pressure log Date: _______________________  a.m. _____________________(1st reading) HR___________            p.m. _____________________(2nd reading) HR__________  Date: _______________________  a.m. _____________________(1st reading) HR___________            p.m. _____________________(2nd reading) HR__________  Date: _______________________  a.m. _____________________(1st reading) HR___________            p.m. _____________________(2nd reading) HR__________  Date: _______________________  a.m. _____________________(1st reading) HR___________            p.m. _____________________(2nd reading) HR__________  Date: _______________________  a.m. _____________________(1st reading) HR___________            p.m. _____________________(2nd reading) HR__________  Date: _______________________  a.m. _____________________(1st reading) HR___________            p.m. _____________________(2nd reading) HR__________  Date: _______________________  a.m. _____________________(1st reading) HR___________            p.m. _____________________(2nd reading)  HR__________   This information is not intended to replace advice given to you by your health care provider. Make sure you discuss any questions you have with your health care provider. Document Revised: 03/12/2020 Document Reviewed: 03/12/2020 Elsevier Patient Education  2021 Elsevier Inc.   Medication Instructions:  Your physician has recommended you make the following change in your medication:   Use nitroglycerin 1 tablet placed under the tongue at the first sign of chest pain or an angina attack. 1 tablet may be used every 5 minutes as needed, for up to 15 minutes. Do not take more than 3 tablets in 15 minutes. If pain persist call 911 or go to the nearest ED.   *If you need a refill on your cardiac medications before your next appointment, please call your pharmacy*   Lab Work: None ordered If you have labs (blood work) drawn today and your tests are completely normal, you will receive your results only by: MyChart Message (if you have MyChart) OR A paper copy in the mail If you have any lab test that is abnormal or we need to change your treatment, we will call you to review the results.   Testing/Procedures: You are scheduled for a Myocardial Perfusion Imaging Study.  Please arrive 15 minutes prior to your appointment time for registration and insurance purposes.  The test will take approximately 3 to 4 hours to complete; you may bring reading material.  If someone comes with you to your appointment, they will need to remain in the main lobby due to limited space in the testing area.   How to prepare for your Myocardial Perfusion Test: Do not eat or drink 3  hours prior to your test, except you may have water. Do not consume products containing caffeine (regular or decaffeinated) 12 hours prior to your test. (ex: coffee, chocolate, sodas, tea). Do bring a list of your current medications with you.  If not listed below, you may take your medications as normal. Do wear  comfortable clothes (no dresses or overalls) and walking shoes, tennis shoes preferred (No heels or open toe shoes are allowed). Do NOT wear cologne, perfume, aftershave, or lotions (deodorant is allowed). If these instructions are not followed, your test will have to be rescheduled.  If you cannot keep your appointment, please provide 24 hours notification to the Nuclear Lab, to avoid a possible $50 charge to your account.  Follow-Up: At [facility], you and your health needs are our priority.  As part of our continuing mission to provide you with exceptional heart care, we have created designated Provider Care Teams.  These Care Teams include your primary Cardiologist (physician) and Advanced Practice Providers (APPs -  Physician Assistants and Nurse Practitioners) who all work together to provide you with the care you need, when you need it.  We recommend signing up for the patient portal called "MyChart".  Sign up information is provided on this After Visit Summary.  MyChart is used to connect with patients for Virtual Visits (Telemedicine).  Patients are able to view lab/test results, encounter notes, upcoming appointments, etc.  Non-urgent messages can be sent to your provider as well.   To learn more about what you can do with MyChart, go to [email or web address].    Your next appointment:   9 month(s)  Provider:   Belva Crome, MD   Other Instructions  Cardiac Nuclear Scan A cardiac nuclear scan is a test that is done to check the flow of blood to your heart. It is done when you are resting and when you are exercising. The test looks for problems such as: Not enough blood reaching a portion of the heart. The heart muscle not working as it should. You may need this test if you have: Heart disease. Lab results that are not normal. Had heart surgery or a balloon procedure to open up blocked arteries (angioplasty) or a small mesh tube (stent). Chest pain. Shortness of  breath. Had a heart attack. In this test, a special dye (tracer) is put into your bloodstream. The tracer will travel to your heart. A camera will then take pictures of your heart to see how the tracer moves through your heart. This test is usually done at a hospital and takes 2-4 hours. Tell a doctor about: Any allergies you have. All medicines you are taking, including vitamins, herbs, eye drops, creams, and over-the-counter medicines. Any bleeding problems you have. Any surgeries you have had. Any medical conditions you have. Whether you are pregnant or may be pregnant. Any history of asthma or long-term (chronic) lung disease. Any history of heart rhythm disorders or heart valve conditions. What are the risks? Your doctor will talk with you about risks. These may include: Serious chest pain and heart attack. This is only a risk if the stress portion of the test is done. Fast or uneven heartbeats (palpitations). A feeling of warmth in your chest. This feeling usually does not last long. Allergic reaction to the tracer. Shortness of breath or trouble breathing. What happens before the test? Ask your doctor about changing or stopping your normal medicines. Follow instructions from your doctor about what you cannot eat or drink. Remove  your jewelry on the day of the test. Ask your doctor if you need to avoid nicotine or caffeine. What happens during the test? An IV tube will be inserted into one of your veins. Your doctor will give you a small amount of tracer through the IV tube. You will wait for 20-40 minutes while the tracer moves through your bloodstream. Your heart will be monitored with an electrocardiogram (ECG). You will lie down on an exam table. Pictures of your heart will be taken for about 15-20 minutes. You may also have a stress test. For this test, one of these things may be done: You will be asked to exercise on a treadmill or a stationary bike. You will be given  medicines that will make your heart work harder. This is done if you are unable to exercise. When blood flow to your heart has peaked, a tracer will again be given through the IV tube. After 20-40 minutes, you will get back on the exam table. More pictures will be taken of your heart. Depending on the tracer that is used, more pictures may need to be taken 3-4 hours later. Your IV tube will be removed when the test is over. The test may vary among doctors and hospitals. What happens after the test? Ask your doctor: Whether you can return to your normal schedule, including diet, activities, travel, and medicines. Whether you should drink more fluids. This will help to remove the tracer from your body. Ask your doctor, or the department that is doing the test: When will my results be ready? How will I get my results? What are my treatment options? What other tests do I need? What are my next steps? This information is not intended to replace advice given to you by your health care provider. Make sure you discuss any questions you have with your health care provider. Document Revised: 04/20/2022 Document Reviewed: 04/20/2022 Elsevier Patient Education  2023 [employer].

## 2023-09-08 ENCOUNTER — Telehealth: Payer: Self-pay

## 2023-09-08 ENCOUNTER — Ambulatory Visit: Payer: PPO | Attending: Cardiology

## 2023-09-08 VITALS — Ht 69.0 in | Wt 176.0 lb

## 2023-09-08 DIAGNOSIS — R110 Nausea: Secondary | ICD-10-CM

## 2023-09-08 DIAGNOSIS — R079 Chest pain, unspecified: Secondary | ICD-10-CM

## 2023-09-08 DIAGNOSIS — I2510 Atherosclerotic heart disease of native coronary artery without angina pectoris: Secondary | ICD-10-CM

## 2023-09-08 LAB — MYOCARDIAL PERFUSION IMAGING
Estimated workload: 11.7
Exercise duration (min): 9 min
Exercise duration (sec): 31 s
LV dias vol: 76 mL (ref 62–150)
LV sys vol: 30 mL
MPHR: 155 {beats}/min
Nuc Stress EF: 61 %
Peak HR: 150 {beats}/min
Percent HR: 96 %
Rest HR: 73 {beats}/min
Rest Nuclear Isotope Dose: 10.1 mCi
SDS: 2
SRS: 5
SSS: 7
Stress Nuclear Isotope Dose: 29.4 mCi
TID: 1.17

## 2023-09-08 MED ORDER — AMINOPHYLLINE 25 MG/ML IV SOLN: 75.0000 mg | Freq: Once | INTRAVENOUS | Status: AC

## 2023-09-08 MED ORDER — TECHNETIUM TC 99M TETROFOSMIN IV KIT
29.4000 | PACK | Freq: Once | INTRAVENOUS | Status: AC | PRN
  Administered 2023-09-08: 29.4 via INTRAVENOUS

## 2023-09-08 MED ORDER — REGADENOSON 0.4 MG/5ML IV SOLN: 0.4000 mg | Freq: Once | INTRAVENOUS | Status: AC

## 2023-09-08 MED ORDER — TECHNETIUM TC 99M TETROFOSMIN IV KIT
10.1000 | PACK | Freq: Once | INTRAVENOUS | Status: AC | PRN
  Administered 2023-09-08: 10.1 via INTRAVENOUS

## 2023-09-08 NOTE — Telephone Encounter (Signed)
Patient returned RN's call regarding results. 

## 2023-09-08 NOTE — Telephone Encounter (Signed)
-----   Message from Aundra Dubin Revankar sent at 09/08/2023 12:46 PM EDT ----- The results of the study is unremarkable. Please inform patient. I will discuss in detail at next appointment. Cc  primary care/referring physician Garwin Brothers, MD 09/08/2023 12:46 PM

## 2023-09-08 NOTE — Telephone Encounter (Signed)
Results reviewed with pt as per Dr. Revankar's note.  Pt verbalized understanding and had no additional questions. Routed to PCP.  

## 2023-09-08 NOTE — Telephone Encounter (Signed)
Left vm to call back

## 2023-09-19 ENCOUNTER — Other Ambulatory Visit: Payer: Self-pay

## 2023-09-19 ENCOUNTER — Telehealth: Payer: Self-pay | Admitting: Cardiology

## 2023-09-19 DIAGNOSIS — N401 Enlarged prostate with lower urinary tract symptoms: Secondary | ICD-10-CM | POA: Diagnosis not present

## 2023-09-19 NOTE — Telephone Encounter (Signed)
Pt c/o medication issue:  1. Name of Medication: NitroStat  2. How are you currently taking this medication (dosage and times per day)?   3. Are you having a reaction (difficulty breathing--STAT)?   4. What is your medication issue? Patient wants to know if Dr Tomie China still wants him to get this prescription to have on hand

## 2023-09-19 NOTE — Telephone Encounter (Signed)
Called patient and he reported that he went to his urologist today and they stopped his Cialis. He is calling in to the office today because he would like to know if Dr. Tomie China would like him to fill his Nitrostat prescription now that he is not taking Cialis. He would like to know how long he needs to be off Cialis before he can use Nitrostat if he has chest pain again. Please advise.

## 2023-09-20 DIAGNOSIS — I700 Atherosclerosis of aorta: Secondary | ICD-10-CM | POA: Diagnosis not present

## 2023-09-20 DIAGNOSIS — I2510 Atherosclerotic heart disease of native coronary artery without angina pectoris: Secondary | ICD-10-CM | POA: Diagnosis not present

## 2023-09-20 DIAGNOSIS — Z125 Encounter for screening for malignant neoplasm of prostate: Secondary | ICD-10-CM | POA: Diagnosis not present

## 2023-09-20 DIAGNOSIS — Z6826 Body mass index (BMI) 26.0-26.9, adult: Secondary | ICD-10-CM | POA: Diagnosis not present

## 2023-09-20 DIAGNOSIS — I10 Essential (primary) hypertension: Secondary | ICD-10-CM | POA: Diagnosis not present

## 2023-09-20 DIAGNOSIS — E538 Deficiency of other specified B group vitamins: Secondary | ICD-10-CM | POA: Diagnosis not present

## 2023-09-20 DIAGNOSIS — F411 Generalized anxiety disorder: Secondary | ICD-10-CM | POA: Diagnosis not present

## 2023-09-20 DIAGNOSIS — M5414 Radiculopathy, thoracic region: Secondary | ICD-10-CM | POA: Diagnosis not present

## 2023-09-21 NOTE — Telephone Encounter (Signed)
Called patient and informed him of Dr. Vanetta Shawl recommendation below:  "He can refill on nitroglycerin no problem.  Within next 2 to 3 days after that she has been discontinued to be fine to take nitroglycerin if needed"  Then the patient informs me that he may have to go back on his Cialis medication. I informed him that we should wait until that decision is made then based on if he goes back on the medication or not we can prescribe him nitroglycerin. Patient verbalized understanding and he had no further questions at this time.

## 2023-09-22 DIAGNOSIS — I2510 Atherosclerotic heart disease of native coronary artery without angina pectoris: Secondary | ICD-10-CM | POA: Diagnosis not present

## 2023-09-22 DIAGNOSIS — F419 Anxiety disorder, unspecified: Secondary | ICD-10-CM | POA: Diagnosis not present

## 2023-09-22 DIAGNOSIS — Z7982 Long term (current) use of aspirin: Secondary | ICD-10-CM | POA: Diagnosis not present

## 2023-09-22 DIAGNOSIS — R4589 Other symptoms and signs involving emotional state: Secondary | ICD-10-CM | POA: Diagnosis not present

## 2023-09-22 DIAGNOSIS — R079 Chest pain, unspecified: Secondary | ICD-10-CM | POA: Diagnosis not present

## 2023-09-22 DIAGNOSIS — I700 Atherosclerosis of aorta: Secondary | ICD-10-CM | POA: Diagnosis not present

## 2023-09-22 DIAGNOSIS — Z79899 Other long term (current) drug therapy: Secondary | ICD-10-CM | POA: Diagnosis not present

## 2023-09-22 DIAGNOSIS — I10 Essential (primary) hypertension: Secondary | ICD-10-CM | POA: Diagnosis not present

## 2023-09-22 DIAGNOSIS — R0602 Shortness of breath: Secondary | ICD-10-CM | POA: Diagnosis not present

## 2023-09-22 DIAGNOSIS — R918 Other nonspecific abnormal finding of lung field: Secondary | ICD-10-CM | POA: Diagnosis not present

## 2023-10-04 DIAGNOSIS — Z6826 Body mass index (BMI) 26.0-26.9, adult: Secondary | ICD-10-CM | POA: Diagnosis not present

## 2023-10-04 DIAGNOSIS — R9389 Abnormal findings on diagnostic imaging of other specified body structures: Secondary | ICD-10-CM | POA: Diagnosis not present

## 2023-10-04 DIAGNOSIS — F411 Generalized anxiety disorder: Secondary | ICD-10-CM | POA: Diagnosis not present

## 2023-10-07 DIAGNOSIS — R9389 Abnormal findings on diagnostic imaging of other specified body structures: Secondary | ICD-10-CM | POA: Diagnosis not present

## 2023-10-17 ENCOUNTER — Other Ambulatory Visit: Payer: Self-pay | Admitting: Cardiology

## 2023-10-21 DIAGNOSIS — N401 Enlarged prostate with lower urinary tract symptoms: Secondary | ICD-10-CM | POA: Diagnosis not present

## 2023-10-21 DIAGNOSIS — Z125 Encounter for screening for malignant neoplasm of prostate: Secondary | ICD-10-CM | POA: Diagnosis not present

## 2023-10-27 DIAGNOSIS — M722 Plantar fascial fibromatosis: Secondary | ICD-10-CM | POA: Diagnosis not present

## 2023-11-17 DIAGNOSIS — I700 Atherosclerosis of aorta: Secondary | ICD-10-CM | POA: Diagnosis not present

## 2023-11-17 DIAGNOSIS — R972 Elevated prostate specific antigen [PSA]: Secondary | ICD-10-CM | POA: Diagnosis not present

## 2023-11-17 DIAGNOSIS — I7143 Infrarenal abdominal aortic aneurysm, without rupture: Secondary | ICD-10-CM | POA: Diagnosis not present

## 2023-11-24 DIAGNOSIS — F411 Generalized anxiety disorder: Secondary | ICD-10-CM | POA: Diagnosis not present

## 2024-01-26 DIAGNOSIS — E538 Deficiency of other specified B group vitamins: Secondary | ICD-10-CM | POA: Diagnosis not present

## 2024-01-26 DIAGNOSIS — F411 Generalized anxiety disorder: Secondary | ICD-10-CM | POA: Diagnosis not present

## 2024-01-26 DIAGNOSIS — Z6826 Body mass index (BMI) 26.0-26.9, adult: Secondary | ICD-10-CM | POA: Diagnosis not present

## 2024-01-26 DIAGNOSIS — I700 Atherosclerosis of aorta: Secondary | ICD-10-CM | POA: Diagnosis not present

## 2024-01-26 DIAGNOSIS — I739 Peripheral vascular disease, unspecified: Secondary | ICD-10-CM | POA: Diagnosis not present

## 2024-01-26 DIAGNOSIS — M792 Neuralgia and neuritis, unspecified: Secondary | ICD-10-CM | POA: Diagnosis not present

## 2024-01-26 DIAGNOSIS — F331 Major depressive disorder, recurrent, moderate: Secondary | ICD-10-CM | POA: Diagnosis not present

## 2024-01-26 DIAGNOSIS — I2510 Atherosclerotic heart disease of native coronary artery without angina pectoris: Secondary | ICD-10-CM | POA: Diagnosis not present

## 2024-02-16 DIAGNOSIS — S0911XA Strain of muscle and tendon of head, initial encounter: Secondary | ICD-10-CM | POA: Diagnosis not present

## 2024-02-16 DIAGNOSIS — Z6826 Body mass index (BMI) 26.0-26.9, adult: Secondary | ICD-10-CM | POA: Diagnosis not present

## 2024-02-16 DIAGNOSIS — K047 Periapical abscess without sinus: Secondary | ICD-10-CM | POA: Diagnosis not present

## 2024-02-27 DIAGNOSIS — R079 Chest pain, unspecified: Secondary | ICD-10-CM | POA: Diagnosis not present

## 2024-02-27 DIAGNOSIS — K219 Gastro-esophageal reflux disease without esophagitis: Secondary | ICD-10-CM | POA: Diagnosis not present

## 2024-02-27 DIAGNOSIS — Z6826 Body mass index (BMI) 26.0-26.9, adult: Secondary | ICD-10-CM | POA: Diagnosis not present

## 2024-05-24 ENCOUNTER — Encounter: Payer: Self-pay | Admitting: Cardiology

## 2024-05-24 ENCOUNTER — Ambulatory Visit: Attending: Cardiology | Admitting: Cardiology

## 2024-05-24 VITALS — BP 110/80 | HR 69 | Ht 69.0 in | Wt 175.0 lb

## 2024-05-24 DIAGNOSIS — I2510 Atherosclerotic heart disease of native coronary artery without angina pectoris: Secondary | ICD-10-CM

## 2024-05-24 DIAGNOSIS — E782 Mixed hyperlipidemia: Secondary | ICD-10-CM | POA: Diagnosis not present

## 2024-05-24 DIAGNOSIS — I714 Abdominal aortic aneurysm, without rupture, unspecified: Secondary | ICD-10-CM | POA: Diagnosis not present

## 2024-05-24 DIAGNOSIS — I10 Essential (primary) hypertension: Secondary | ICD-10-CM

## 2024-05-24 MED ORDER — ASPIRIN EC 81 MG PO TBEC
162.0000 mg | DELAYED_RELEASE_TABLET | Freq: Every day | ORAL | 3 refills | Status: DC
Start: 2024-05-24 — End: 2024-05-30

## 2024-05-24 NOTE — Addendum Note (Signed)
 Addended by: Ronique Simerly, Jyl Or L on: 05/24/2024 10:21 AM   Modules accepted: Orders

## 2024-05-24 NOTE — Progress Notes (Signed)
 Cardiology Office Note:    Date:  05/24/2024   ID:  Brandon Wong, DOB 09-22-1956, MRN [id number]  PCP:  Alonso Jan., MD  Cardiologist:  Nelia Balzarine, MD   Referring MD: Alonso Jan., MD    ASSESSMENT:    1. Essential hypertension   2. Abdominal aortic aneurysm (AAA) without rupture, unspecified part (HCC)   3. Coronary artery disease involving native coronary artery of native heart without angina pectoris   4. Mixed dyslipidemia    PLAN:    In order of problems listed above:  Coronary artery disease: Secondary prevention stressed to the patient.  Importance of compliance with diet and medication stressed any vocalized understanding.  I congratulated him about his excellent exercise protocol. Essential hypertension: Blood pressure stable and diet was emphasized. Mixed dyslipidemia: On lipid-lowering medications.  Lipids reviewed from last evaluation and he will have recheck complete blood work today per his request.  He will also do hemoglobin A1c and vitamin D in view of history of vitamin D deficiency in the past. Patient will be seen in follow-up appointment in 9 months or earlier if the patient has any concerns.    Medication Adjustments/Labs and Tests Ordered: Current medicines are reviewed at length with the patient today.  Concerns regarding medicines are outlined above.  Orders Placed This Encounter  Procedures   EKG 12-Lead   No orders of the defined types were placed in this encounter.    Chief Complaint  Patient presents with   Follow-up     History of Present Illness:    Brandon Wong is a 68 y.o. male.  Patient has past medical history of coronary artery disease, essential hypertension, mixed dyslipidemia.  He denies any problems at this time and takes care of activities of daily living.  He is here for routine follow-up.  At the time of my evaluation, the patient is alert awake oriented and in no distress.  He walks at least  half an hour a day on a daily basis.  Past Medical History:  Diagnosis Date   Anxiety and depression    ANXIETY DEPRESSION 05/10/2008   Qualifier: Diagnosis of  By: Sharlette Dayhoff CMA, [place]     ARTHRITIS 04/14/2009   Qualifier: Diagnosis of   By: Georgia Kipper MD, Oakley Bellman     Replacing diagnoses that were inactivated after the 03/07/23 regulatory import     Benign prostatic hyperplasia 04/18/2020   Body mass index (BMI) 27.0-27.9, adult 11/08/2019   BREAST MASS, RIGHT 06/13/2008   Qualifier: Diagnosis of  By: Plotnikov MD, Aleksei V    Cellulitis and abscess of unspecified site 06/13/2008   Qualifier: Diagnosis of  By: Plotnikov MD, Oakley Bellman    Chronic bilateral low back pain with right-sided sciatica 11/21/2007   Qualifier: Diagnosis of  By: Plotnikov MD, Oakley Bellman    Chronic headache    pt states no longer an issue   COLONIC POLYPS, HX OF 11/21/2007   Qualifier: Diagnosis of  By: Plotnikov MD, Oakley Bellman    Coronary artery disease involving native coronary artery of native heart without angina pectoris 06/15/2017   DDD (degenerative disc disease), lumbar 10/09/2019   Degenerative disc disease, cervical    DEGENERATIVE DISC DISEASE, CERVICAL SPINE 05/10/2008   Qualifier: Diagnosis of  By: Sharlette Dayhoff CMA, [place]     Diffuse abdominal pain 01/14/2022   DOE (dyspnea on exertion) 10/21/2020   Dyslipidemia    Epididymal cyst 07/06/2019   Essential hypertension 05/10/2008  Qualifier: Diagnosis of  By: Sharlette Dayhoff CMA, [place]     Ex-smoker 07/12/2018   External hemorrhoids    Family history of acute myocardial infarction    Gastritis    in the past per pt    GERD 05/10/2008   Qualifier: Diagnosis of  By: Sharlette Dayhoff CMA, [place]     GERD (gastroesophageal reflux disease)    in the past per pt    Hemorrhoids, internal, with bleeding 02/21/2020   History of colonic polyps    Hypertension    Impingement syndrome of right shoulder    KNEE PAIN, LEFT 09/12/2009   Qualifier: Diagnosis of  By: Autry Legions MD,  Alveda Aures    LATERAL EPICONDYLITIS OF ELBOW 05/10/2008   Qualifier: History of   By: Sharlette Dayhoff CMA, [place]      Replacing diagnoses that were inactivated after the 03/07/23 regulatory import     Low back pain    Lumbar radiculopathy 10/09/2019   Lumbosacral radiculopathy at L5 08/28/2019   Meralgia paresthetica    METHICILLIN RESISTANT STAPHYLOCOCCUS AUREUS INFECTION 06/13/2008   Qualifier: Diagnosis of  By: Georgia Kipper MD, Oakley Bellman    Mixed dyslipidemia 05/10/2008   Qualifier: Diagnosis of  By: Sharlette Dayhoff CMA, [place]     MRSA (methicillin resistant Staphylococcus aureus) 2009   Skin   Muscle spasm 05/11/2021   Osteoarthritis    Plantar fasciitis 08/04/2017   Psoriasis    Possible psoriatic arthritis 2010   Renal calculus    RENAL CALCULUS, HX OF 05/10/2008   Qualifier: Diagnosis of  By: Sharlette Dayhoff CMA, [place]     Screening for malignant neoplasm of colon 03/26/2021   SHOULDER IMPINGEMENT SYNDROME, RIGHT 05/10/2008   Qualifier: History of  By: Sharlette Dayhoff CMA, [place]     SKIN RASH 04/14/2009   Qualifier: Diagnosis of  By: Georgia Kipper MD, Oakley Bellman    Sleep disorder    pt states he doesn't have this anymore   SLEEP DISORDER, HX OF 05/10/2008   Qualifier: Diagnosis of  By: Sharlette Dayhoff CMA, [place]     Spondylosis of lumbar region without myelopathy or radiculopathy 05/11/2021   Tubular adenoma of colon 02/25/2016    Past Surgical History:  Procedure Laterality Date   COLONOSCOPY     CORONARY ANGIOPLASTY WITH STENT PLACEMENT  2013   3 stents   FINGER SURGERY     POLYPECTOMY     ROTATOR CUFF REPAIR     right    Current Medications: Current Meds  Medication Sig   aspirin EC 81 MG tablet Take 81 mg by mouth daily.   atorvastatin  (LIPITOR) 40 MG tablet TAKE ONE TABLET BY MOUTH ONCE DAILY   esomeprazole (NEXIUM) 40 MG capsule Take 40 mg by mouth daily.   lactulose (CHRONULAC) 10 GM/15ML solution Take 30 mLs by mouth every 6 (six) hours as needed for constipation.   lisinopril  (ZESTRIL ) 20 MG  tablet Take 20 mg by mouth daily.   nitroGLYCERIN  (NITROSTAT ) 0.4 MG SL tablet Place 0.4 mg under the tongue every 5 (five) minutes as needed for chest pain.   tadalafil (CIALIS) 5 MG tablet Take 5 mg by mouth daily as needed for erectile dysfunction.     Allergies:   Escitalopram oxalate and Venlafaxine   Social History   Socioeconomic History   Marital status: Married    Spouse name: Not on file   Number of children: 4   Years of education: Not on file   Highest education level: Not on file  Occupational History   Not  on file  Tobacco Use   Smoking status: Former    Current packs/day: 0.00    Types: Cigarettes    Quit date: 06/15/2012    Years since quitting: 11.9   Smokeless tobacco: Never  Vaping Use   Vaping status: Never Used  Substance and Sexual Activity   Alcohol use: Never   Drug use: Never   Sexual activity: Not on file  Other Topics Concern   Not on file  Social History Narrative   Lives at home with wife & 2 sons   Right handed   Social Drivers of Corporate investment banker Strain: Not on file  Food Insecurity: Not on file  Transportation Needs: Not on file  Physical Activity: Not on file  Stress: Not on file  Social Connections: Not on file     Family History: The patient's family history includes Heart attack in his mother; Heart disease in his mother.  ROS:   Please see the history of present illness.    All other systems reviewed and are negative.  EKGs/Labs/Other Studies Reviewed:    The following studies were reviewed today: .Aaron AasEKG Interpretation Date/Time:  Thursday May 24 2024 09:10:04 EDT Ventricular Rate:  69 PR Interval:  142 QRS Duration:  74 QT Interval:  376 QTC Calculation: 402 R Axis:   52  Text Interpretation: Normal sinus rhythm Normal ECG When compared with ECG of 13-Sep-2000 06:23, No significant change was found Confirmed by Hillis Lu [phone]) on 05/24/2024 9:20:49 AM     Recent Labs: No results found for  requested labs within last 365 days.  Recent Lipid Panel    Component Value Date/Time   CHOL 155 04/05/2022 0933   TRIG 116 04/05/2022 0933   HDL 39 (L) 04/05/2022 0933   CHOLHDL 4.0 04/05/2022 0933   CHOLHDL 6.5 CALC 05/30/2007 0918   VLDL 15 05/30/2007 0918   LDLCALC 95 04/05/2022 0933    Physical Exam:    VS:  BP 110/80   Pulse 69   Ht 5' 9 (1.753 m)   Wt 175 lb (79.4 kg)   SpO2 98%   BMI 25.84 kg/m     Wt Readings from Last 3 Encounters:  05/24/24 175 lb (79.4 kg)  09/08/23 176 lb (79.8 kg)  09/07/23 176 lb (79.8 kg)     GEN: Patient is in no acute distress HEENT: Normal NECK: No JVD; No carotid bruits LYMPHATICS: No lymphadenopathy CARDIAC: Hear sounds regular, 2/6 systolic murmur at the apex. RESPIRATORY:  Clear to auscultation without rales, wheezing or rhonchi  ABDOMEN: Soft, non-tender, non-distended MUSCULOSKELETAL:  No edema; No deformity  SKIN: Warm and dry NEUROLOGIC:  Alert and oriented x 3 PSYCHIATRIC:  Normal affect   Signed, Nelia Balzarine, MD  05/24/2024 9:28 AM    [facility]

## 2024-05-24 NOTE — Patient Instructions (Signed)
 Medication Instructions:  Continue current medications *If you need a refill on your cardiac medications before your next appointment, please call your pharmacy*  Lab Work: Tsh,bmet, cbc,lfts, Lipid Panel, A1C. Vitamin d If you have labs (blood work) drawn today and your tests are completely normal, you will receive your results only by: MyChart Message (if you have MyChart) OR A paper copy in the mail If you have any lab test that is abnormal or we need to change your treatment, we will call you to review the results.  Testing/Procedures: none  Follow-Up: At [facility], you and your health needs are our priority.  As part of our continuing mission to provide you with exceptional heart care, our providers are all part of one team.  This team includes your primary Cardiologist (physician) and Advanced Practice Providers or APPs (Physician Assistants and Nurse Practitioners) who all work together to provide you with the care you need, when you need it.  Your next appointment:   9 month(s)  Provider:   Hillis Lu, MD    We recommend signing up for the patient portal called MyChart.  Sign up information is provided on this After Visit Summary.  MyChart is used to connect with patients for Virtual Visits (Telemedicine).  Patients are able to view lab/test results, encounter notes, upcoming appointments, etc.  Non-urgent messages can be sent to your provider as well.   To learn more about what you can do with MyChart, go to [email or web address].   Other Instructions none

## 2024-05-25 ENCOUNTER — Ambulatory Visit: Payer: Self-pay | Admitting: Cardiology

## 2024-05-25 LAB — HEMOGLOBIN A1C
Est. average glucose Bld gHb Est-mCnc: 108 mg/dL
Hgb A1c MFr Bld: 5.4 % (ref 4.8–5.6)

## 2024-05-25 LAB — BASIC METABOLIC PANEL WITH GFR
BUN/Creatinine Ratio: 12 (ref 10–24)
BUN: 12 mg/dL (ref 8–27)
CO2: 21 mmol/L (ref 20–29)
Calcium: 9.6 mg/dL (ref 8.6–10.2)
Chloride: 103 mmol/L (ref 96–106)
Creatinine, Ser: 0.97 mg/dL (ref 0.76–1.27)
Glucose: 83 mg/dL (ref 70–99)
Potassium: 4.4 mmol/L (ref 3.5–5.2)
Sodium: 142 mmol/L (ref 134–144)
eGFR: 87 mL/min/{1.73_m2} (ref >59)

## 2024-05-25 LAB — HEPATIC FUNCTION PANEL
ALT: 12 IU/L (ref 0–44)
AST: 13 IU/L (ref 0–40)
Albumin: 4.7 g/dL (ref 3.9–4.9)
Alkaline Phosphatase: 92 IU/L (ref 44–121)
Bilirubin Total: 0.6 mg/dL (ref 0.0–1.2)
Bilirubin, Direct: 0.19 mg/dL (ref 0.00–0.40)
Total Protein: 6.6 g/dL (ref 6.0–8.5)

## 2024-05-25 LAB — CBC WITH DIFFERENTIAL/PLATELET
Basophils Absolute: 0 10*3/uL (ref 0.0–0.2)
Basos: 1 %
EOS (ABSOLUTE): 0.1 10*3/uL (ref 0.0–0.4)
Eos: 2 %
Hematocrit: 45 % (ref 37.5–51.0)
Hemoglobin: 14.5 g/dL (ref 13.0–17.7)
Immature Grans (Abs): 0 10*3/uL (ref 0.0–0.1)
Immature Granulocytes: 0 %
Lymphocytes Absolute: 1.5 10*3/uL (ref 0.7–3.1)
Lymphs: 27 %
MCH: 29.9 pg (ref 26.6–33.0)
MCHC: 32.2 g/dL (ref 31.5–35.7)
MCV: 93 fL (ref 79–97)
Monocytes Absolute: 0.5 10*3/uL (ref 0.1–0.9)
Monocytes: 10 %
Neutrophils Absolute: 3.4 10*3/uL (ref 1.4–7.0)
Neutrophils: 60 %
Platelets: 258 10*3/uL (ref 150–450)
RBC: 4.85 x10E6/uL (ref 4.14–5.80)
RDW: 12.3 % (ref 11.6–15.4)
WBC: 5.6 10*3/uL (ref 3.4–10.8)

## 2024-05-25 LAB — LIPID PANEL
Chol/HDL Ratio: 3.2 ratio (ref 0.0–5.0)
Cholesterol, Total: 123 mg/dL (ref 100–199)
HDL: 39 mg/dL — ABNORMAL LOW (ref >39)
LDL Chol Calc (NIH): 68 mg/dL (ref 0–99)
Triglycerides: 83 mg/dL (ref 0–149)
VLDL Cholesterol Cal: 16 mg/dL (ref 5–40)

## 2024-05-25 LAB — TSH: TSH: 1.38 u[IU]/mL (ref 0.450–4.500)

## 2024-05-25 LAB — VITAMIN D 25 HYDROXY (VIT D DEFICIENCY, FRACTURES): Vit D, 25-Hydroxy: 45.1 ng/mL (ref 30.0–100.0)

## 2024-05-28 ENCOUNTER — Telehealth: Payer: Self-pay | Admitting: Cardiology

## 2024-05-28 NOTE — Telephone Encounter (Signed)
 Pt c/o medication issue:  1. Name of Medication:   aspirin  EC 81 MG tablet    2. How are you currently taking this medication (dosage and times per day)? Take 2 tablets (162 mg total) by mouth daily.   3. Are you having a reaction (difficulty breathing--STAT)? no  4. What is your medication issue? Patient states that when he was in the office, Dr Edwyna told him to take one pill a day. But the prescription says two. Please advise

## 2024-05-30 MED ORDER — ASPIRIN EC 81 MG PO TBEC: 81.0000 mg | DELAYED_RELEASE_TABLET | Freq: Every day | ORAL | Status: AC

## 2024-05-30 NOTE — Telephone Encounter (Signed)
 Recommendations reviewed with pt as per Dr. Kem Parkinson note.  Pt verbalized understanding and had no additional questions.

## 2024-05-30 NOTE — Telephone Encounter (Signed)
Results reviewed with pt as per Dr. Revankar's note.  Pt verbalized understanding and had no additional questions. Routed to PCP.  

## 2024-07-24 DIAGNOSIS — E538 Deficiency of other specified B group vitamins: Secondary | ICD-10-CM | POA: Diagnosis not present

## 2024-07-24 DIAGNOSIS — I2510 Atherosclerotic heart disease of native coronary artery without angina pectoris: Secondary | ICD-10-CM | POA: Diagnosis not present

## 2024-07-24 DIAGNOSIS — F331 Major depressive disorder, recurrent, moderate: Secondary | ICD-10-CM | POA: Diagnosis not present

## 2024-07-24 DIAGNOSIS — I739 Peripheral vascular disease, unspecified: Secondary | ICD-10-CM | POA: Diagnosis not present

## 2024-07-24 DIAGNOSIS — M792 Neuralgia and neuritis, unspecified: Secondary | ICD-10-CM | POA: Diagnosis not present

## 2024-07-24 DIAGNOSIS — I700 Atherosclerosis of aorta: Secondary | ICD-10-CM | POA: Diagnosis not present

## 2024-07-24 DIAGNOSIS — Z6825 Body mass index (BMI) 25.0-25.9, adult: Secondary | ICD-10-CM | POA: Diagnosis not present

## 2024-07-24 DIAGNOSIS — F411 Generalized anxiety disorder: Secondary | ICD-10-CM | POA: Diagnosis not present

## 2024-07-24 DIAGNOSIS — I10 Essential (primary) hypertension: Secondary | ICD-10-CM | POA: Diagnosis not present

## 2024-08-02 DIAGNOSIS — Z6825 Body mass index (BMI) 25.0-25.9, adult: Secondary | ICD-10-CM | POA: Diagnosis not present

## 2024-08-02 DIAGNOSIS — K625 Hemorrhage of anus and rectum: Secondary | ICD-10-CM | POA: Diagnosis not present

## 2024-09-12 DIAGNOSIS — Z9181 History of falling: Secondary | ICD-10-CM | POA: Diagnosis not present

## 2024-09-12 DIAGNOSIS — Z0000 Encounter for general adult medical examination without abnormal findings: Secondary | ICD-10-CM | POA: Diagnosis not present

## 2024-09-21 DIAGNOSIS — Z23 Encounter for immunization: Secondary | ICD-10-CM | POA: Diagnosis not present

## 2024-10-15 ENCOUNTER — Other Ambulatory Visit: Payer: Self-pay | Admitting: Cardiology

## 2024-10-17 MED ORDER — ATORVASTATIN CALCIUM 40 MG PO TABS
40.0000 mg | ORAL_TABLET | Freq: Every day | ORAL | 2 refills | Status: AC
Start: 2024-10-17 — End: ?

## 2024-11-27 DIAGNOSIS — Z6827 Body mass index (BMI) 27.0-27.9, adult: Secondary | ICD-10-CM | POA: Diagnosis not present

## 2024-11-27 DIAGNOSIS — J208 Acute bronchitis due to other specified organisms: Secondary | ICD-10-CM | POA: Diagnosis not present

## 2025-02-26 ENCOUNTER — Ambulatory Visit: Admitting: Cardiology
